# Patient Record
Sex: Female | Born: 1995 | Hispanic: No
Health system: Southern US, Community
[De-identification: ages and names within clinical notes are randomized; demographics above are authoritative.]

## PROBLEM LIST (undated history)

## (undated) DIAGNOSIS — L309 Dermatitis, unspecified: Secondary | ICD-10-CM

## (undated) DIAGNOSIS — T50902A Poisoning by unspecified drugs, medicaments and biological substances, intentional self-harm, initial encounter: Secondary | ICD-10-CM

## (undated) DIAGNOSIS — F1910 Other psychoactive substance abuse, uncomplicated: Secondary | ICD-10-CM

## (undated) DIAGNOSIS — F913 Oppositional defiant disorder: Secondary | ICD-10-CM

## (undated) HISTORY — PX: WISDOM TOOTH EXTRACTION: SHX21

---
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
## 2013-04-19 ENCOUNTER — Encounter (HOSPITAL_COMMUNITY): Payer: Self-pay | Admitting: *Deleted

## 2013-04-19 ENCOUNTER — Emergency Department (HOSPITAL_COMMUNITY)
Admission: EM | Admit: 2013-04-19 | Discharge: 2013-04-20 | Disposition: A | Discharge status: Home / Self Care | Payer: Medicaid Other | Attending: Emergency Medicine | Admitting: Emergency Medicine

## 2013-04-19 DIAGNOSIS — F17200 Nicotine dependence, unspecified, uncomplicated: Secondary | ICD-10-CM | POA: Insufficient documentation

## 2013-04-19 DIAGNOSIS — Z79899 Other long term (current) drug therapy: Secondary | ICD-10-CM | POA: Insufficient documentation

## 2013-04-19 DIAGNOSIS — Z3202 Encounter for pregnancy test, result negative: Secondary | ICD-10-CM | POA: Insufficient documentation

## 2013-04-19 DIAGNOSIS — Z872 Personal history of diseases of the skin and subcutaneous tissue: Secondary | ICD-10-CM | POA: Insufficient documentation

## 2013-04-19 DIAGNOSIS — E876 Hypokalemia: Secondary | ICD-10-CM

## 2013-04-19 DIAGNOSIS — R109 Unspecified abdominal pain: Secondary | ICD-10-CM | POA: Insufficient documentation

## 2013-04-19 DIAGNOSIS — Z8659 Personal history of other mental and behavioral disorders: Secondary | ICD-10-CM | POA: Insufficient documentation

## 2013-04-19 LAB — COMPREHENSIVE METABOLIC PANEL
ALT: 7 U/L (ref 0–35)
AST: 17 U/L (ref 0–37)
Albumin: 4.2 g/dL (ref 3.5–5.2)
Alkaline Phosphatase: 119 U/L — ABNORMAL HIGH (ref 39–117)
BUN: 3 mg/dL — ABNORMAL LOW (ref 6–23)
CO2: 25 mEq/L (ref 19–32)
Calcium: 10.1 mg/dL (ref 8.4–10.5)
Chloride: 102 mEq/L (ref 96–112)
Creatinine, Ser: 0.65 mg/dL (ref 0.50–1.10)
GFR calc Af Amer: >90 mL/min (ref >90)
GFR calc non Af Amer: >90 mL/min (ref >90)
Glucose, Bld: 120 mg/dL — ABNORMAL HIGH (ref 70–99)
Potassium: 3 mEq/L — ABNORMAL LOW (ref 3.5–5.1)
Sodium: 138 mEq/L (ref 135–145)
Total Bilirubin: 0.3 mg/dL (ref 0.3–1.2)
Total Protein: 9.1 g/dL — ABNORMAL HIGH (ref 6.0–8.3)

## 2013-04-19 LAB — CBC WITH DIFFERENTIAL/PLATELET
Basophils Absolute: 0 10*3/uL (ref 0.0–0.1)
Basophils Relative: 0 % (ref 0–1)
Eosinophils Absolute: 0.4 10*3/uL (ref 0.0–0.7)
Eosinophils Relative: 5 % (ref 0–5)
HCT: 41.2 % (ref 36.0–46.0)
Hemoglobin: 14.6 g/dL (ref 12.0–15.0)
Lymphocytes Relative: 38 % (ref 12–46)
Lymphs Abs: 2.8 10*3/uL (ref 0.7–4.0)
MCH: 31.5 pg (ref 26.0–34.0)
MCHC: 35.4 g/dL (ref 30.0–36.0)
MCV: 89 fL (ref 78.0–100.0)
Monocytes Absolute: 0.9 10*3/uL (ref 0.1–1.0)
Monocytes Relative: 13 % — ABNORMAL HIGH (ref 3–12)
Neutro Abs: 3.4 10*3/uL (ref 1.7–7.7)
Neutrophils Relative %: 44 % (ref 43–77)
Platelets: 434 10*3/uL — ABNORMAL HIGH (ref 150–400)
RBC: 4.63 MIL/uL (ref 3.87–5.11)
RDW: 12.7 % (ref 11.5–15.5)
WBC: 7.5 10*3/uL (ref 4.0–10.5)

## 2013-04-19 LAB — LIPASE, BLOOD: Lipase: 49 U/L (ref 11–59)

## 2013-04-19 MED ORDER — ONDANSETRON HCL 4 MG/2ML IJ SOLN
4.0000 mg | Freq: Once | INTRAMUSCULAR | Status: AC
  Administered 2013-04-19: 4 mg via INTRAVENOUS
  Filled 2013-04-19: qty 2

## 2013-04-19 MED ORDER — FENTANYL CITRATE 0.05 MG/ML IJ SOLN
50.0000 ug | Freq: Once | INTRAMUSCULAR | Status: AC
  Administered 2013-04-19: 50 ug via INTRAVENOUS
  Filled 2013-04-19: qty 2

## 2013-04-19 NOTE — ED Notes (Signed)
Pt informed of a need for another urine sample.

## 2013-04-19 NOTE — ED Notes (Signed)
Pt screaming in room and demands to be seen by EDP.  Lying on floor-skin is warm and dry-pt states "I need pain medication now".  Pt back in bed-Mother at bedside.

## 2013-04-19 NOTE — ED Notes (Signed)
Pt in c/o abd pain with nausea x2 hours. History of ovarian cysts.

## 2013-04-19 NOTE — ED Notes (Signed)
Informed by lab that urine sample was not enough to run test.

## 2013-04-19 NOTE — ED Notes (Signed)
Lab informed a newly acquired urine sample will be sent to them.

## 2013-04-20 LAB — URINALYSIS, ROUTINE W REFLEX MICROSCOPIC
Bilirubin Urine: NEGATIVE
Glucose, UA: NEGATIVE mg/dL
Ketones, ur: NEGATIVE mg/dL
Leukocytes, UA: NEGATIVE
Nitrite: NEGATIVE
Protein, ur: NEGATIVE mg/dL
Specific Gravity, Urine: 1.022 (ref 1.005–1.030)
Urobilinogen, UA: 0.2 mg/dL (ref 0.0–1.0)
pH: 5 (ref 5.0–8.0)

## 2013-04-20 LAB — GC/CHLAMYDIA PROBE AMP
CT Probe RNA: NEGATIVE
GC Probe RNA: NEGATIVE

## 2013-04-20 LAB — RAPID URINE DRUG SCREEN, HOSP PERFORMED
Barbiturates: NOT DETECTED
Opiates: NOT DETECTED
Tetrahydrocannabinol: NOT DETECTED

## 2013-04-20 LAB — URINE MICROSCOPIC-ADD ON

## 2013-04-20 LAB — RPR: RPR Ser Ql: NONREACTIVE

## 2013-04-20 LAB — WET PREP, GENITAL
Clue Cells Wet Prep HPF POC: NONE SEEN
Trich, Wet Prep: NONE SEEN
Yeast Wet Prep HPF POC: NONE SEEN

## 2013-04-20 MED ORDER — KETOROLAC TROMETHAMINE 30 MG/ML IJ SOLN
15.0000 mg | Freq: Once | INTRAMUSCULAR | Status: AC
  Administered 2013-04-20: 15 mg via INTRAVENOUS
  Filled 2013-04-20: qty 1

## 2013-04-20 MED ORDER — HYDROMORPHONE HCL PF 1 MG/ML IJ SOLN
1.0000 mg | Freq: Once | INTRAMUSCULAR | Status: AC
  Administered 2013-04-20: 1 mg via INTRAVENOUS
  Filled 2013-04-20: qty 1

## 2013-04-20 MED ORDER — POTASSIUM CHLORIDE CRYS ER 20 MEQ PO TBCR
60.0000 meq | EXTENDED_RELEASE_TABLET | Freq: Once | ORAL | Status: AC
  Administered 2013-04-20: 60 meq via ORAL
  Filled 2013-04-20: qty 3

## 2013-04-20 MED ORDER — ONDANSETRON HCL 4 MG/2ML IJ SOLN
4.0000 mg | Freq: Once | INTRAMUSCULAR | Status: AC
Start: 2013-04-20 — End: 2013-04-20
  Administered 2013-04-20: 4 mg via INTRAVENOUS
  Filled 2013-04-20: qty 2

## 2013-04-20 MED ORDER — TRAMADOL HCL 50 MG PO TABS: 50.0000 mg | ORAL_TABLET | Freq: Four times a day (QID) | ORAL | Status: DC | PRN

## 2013-04-20 NOTE — ED Provider Notes (Signed)
History    17 year old female with abdominal pain. Pain is going on for 2 hours. Pain is across lower abdomen. This clinical S. Patient reports a history of ovarian cyst and states that her current symptoms feel similar to prior episodes of pain which were attributed to this. No vomiting. No unusual vaginal bleeding or discharge. No urinary complaints. No fevers or chills. No intervention prior to arrival.  CSN: [id number]  Arrival date & time 04/19/13  2130   First MD Initiated Contact with Patient 04/20/13 0005      Chief Complaint  Patient presents with  . Abdominal Pain    (Consider location/radiation/quality/duration/timing/severity/associated sxs/prior treatment) HPI  Past Medical History  Diagnosis Date  . Eczema   . Polysubstance abuse     cocaine marijuana  . Drug overdose, intentional   . ODD (oppositional defiant disorder)     Past Surgical History  Procedure Laterality Date  . Wisdom tooth extraction      History reviewed. No pertinent family history.  History  Substance Use Topics  . Smoking status: Current Every Day Smoker -- 1.00 packs/day for 3 years  . Smokeless tobacco: Not on file  . Alcohol Use: Yes    OB History   Grav Para Term Preterm Abortions TAB SAB Ect Mult Living                  Review of Systems  All systems reviewed and negative, other than as noted in HPI.   Allergies  Review of patient's allergies indicates no known allergies.  Home Medications   Current Outpatient Rx  Name  Route  Sig  Dispense  Refill  . cetirizine (ZYRTEC) 10 MG tablet   Oral   Take 10 mg by mouth daily.         Marland Kitchen ibuprofen (ADVIL,MOTRIN) 200 MG tablet   Oral   Take 200 mg by mouth every 6 (six) hours as needed for pain.         . traMADol (ULTRAM) 50 MG tablet   Oral   Take 1 tablet (50 mg total) by mouth every 6 (six) hours as needed for pain.   12 tablet   0     BP 128/90  Pulse 90  Temp(Src) 97.9 F (36.6 C) (Oral)  Resp 20   SpO2 100%  Physical Exam  Nursing note and vitals reviewed. Constitutional: She appears well-developed and well-nourished. No distress.  HENT:  Head: Normocephalic and atraumatic.  Eyes: Conjunctivae are normal. Right eye exhibits no discharge. Left eye exhibits no discharge.  Neck: Neck supple.  Cardiovascular: Normal rate, regular rhythm and normal heart sounds.  Exam reveals no gallop and no friction rub.   No murmur heard. Pulmonary/Chest: Effort normal and breath sounds normal. No respiratory distress.  Abdominal: Soft. She exhibits no distension. There is no tenderness.  Genitourinary:  Normal external female genitalia. No concerning lesions noted. Normal appearing cervix. Scant whitish discharge. No CMT, adnexal mass or tenderness appreciated.   Musculoskeletal: She exhibits no edema and no tenderness.  No cva tenderness  Neurological: She is alert.  Skin: Skin is warm and dry.  Psychiatric: She has a normal mood and affect. Her behavior is normal. Thought content normal.    ED Course  Procedures (including critical care time)  Labs Reviewed  WET PREP, GENITAL - Abnormal; Notable for the following:    WBC, Wet Prep HPF POC RARE (*)    All other components within normal limits  CBC WITH DIFFERENTIAL -  Abnormal; Notable for the following:    Platelets 434 (*)    Monocytes Relative 13 (*)    All other components within normal limits  COMPREHENSIVE METABOLIC PANEL - Abnormal; Notable for the following:    Potassium 3.0 (*)    Glucose, Bld 120 (*)    BUN 3 (*)    Total Protein 9.1 (*)    Alkaline Phosphatase 119 (*)    All other components within normal limits  URINALYSIS, ROUTINE W REFLEX MICROSCOPIC - Abnormal; Notable for the following:    APPearance CLOUDY (*)    Hgb urine dipstick LARGE (*)    All other components within normal limits  GC/CHLAMYDIA PROBE AMP  LIPASE, BLOOD  URINE RAPID DRUG SCREEN (HOSP PERFORMED)  URINE MICROSCOPIC-ADD ON  RPR  POCT PREGNANCY,  URINE   No results found.   1. Abdominal pain   2. Hypokalemia       MDM  17 year old female with lower abdominal/pelvic pain. Her abdominal exam is benign. Her pelvic examination is unremarkable. Very low suspicion for acute surgical abdomen or other emergent process. Plan symptomatic treatment at this time. Emergent return precautions were discussed.        Raeford Razor, MD 04/24/13 2027

## 2014-02-11 ENCOUNTER — Encounter (HOSPITAL_COMMUNITY): Payer: Self-pay | Admitting: Emergency Medicine

## 2014-02-11 ENCOUNTER — Emergency Department (HOSPITAL_COMMUNITY)
Admission: EM | Admit: 2014-02-11 | Discharge: 2014-02-11 | Disposition: A | Discharge status: Home / Self Care | Payer: Medicaid Other | Attending: Emergency Medicine | Admitting: Emergency Medicine

## 2014-02-11 DIAGNOSIS — F17200 Nicotine dependence, unspecified, uncomplicated: Secondary | ICD-10-CM | POA: Insufficient documentation

## 2014-02-11 DIAGNOSIS — Z79899 Other long term (current) drug therapy: Secondary | ICD-10-CM | POA: Insufficient documentation

## 2014-02-11 DIAGNOSIS — F1910 Other psychoactive substance abuse, uncomplicated: Secondary | ICD-10-CM | POA: Insufficient documentation

## 2014-02-11 DIAGNOSIS — F1010 Alcohol abuse, uncomplicated: Secondary | ICD-10-CM | POA: Insufficient documentation

## 2014-02-11 DIAGNOSIS — Z872 Personal history of diseases of the skin and subcutaneous tissue: Secondary | ICD-10-CM | POA: Insufficient documentation

## 2014-02-11 DIAGNOSIS — K2920 Alcoholic gastritis without bleeding: Secondary | ICD-10-CM

## 2014-02-11 DIAGNOSIS — Z3202 Encounter for pregnancy test, result negative: Secondary | ICD-10-CM | POA: Insufficient documentation

## 2014-02-11 DIAGNOSIS — Z8659 Personal history of other mental and behavioral disorders: Secondary | ICD-10-CM | POA: Insufficient documentation

## 2014-02-11 LAB — I-STAT CHEM 8, ED
BUN: <3 mg/dL — ABNORMAL LOW (ref 6–23)
CALCIUM ION: 1.07 mmol/L — AB (ref 1.12–1.23)
Chloride: 105 mEq/L (ref 96–112)
Creatinine, Ser: 0.5 mg/dL (ref 0.50–1.10)
GLUCOSE: 107 mg/dL — AB (ref 70–99)
HEMATOCRIT: 43 % (ref 36.0–46.0)
HEMOGLOBIN: 14.6 g/dL (ref 12.0–15.0)
Potassium: 3.1 mEq/L — ABNORMAL LOW (ref 3.7–5.3)
Sodium: 145 mEq/L (ref 137–147)
TCO2: 18 mmol/L (ref 0–100)

## 2014-02-11 LAB — POC URINE PREG, ED: PREG TEST UR: NEGATIVE

## 2014-02-11 MED ORDER — POTASSIUM CHLORIDE CRYS ER 20 MEQ PO TBCR
40.0000 meq | EXTENDED_RELEASE_TABLET | Freq: Once | ORAL | Status: AC
Start: 2014-02-11 — End: 2014-02-11
  Administered 2014-02-11: 40 meq via ORAL
  Filled 2014-02-11: qty 2

## 2014-02-11 MED ORDER — METOCLOPRAMIDE HCL 10 MG PO TABS: 10.0000 mg | ORAL_TABLET | Freq: Four times a day (QID) | ORAL | Status: DC

## 2014-02-11 MED ORDER — GI COCKTAIL ~~LOC~~
30.0000 mL | Freq: Once | ORAL | Status: AC
  Administered 2014-02-11: 30 mL via ORAL
  Filled 2014-02-11: qty 30

## 2014-02-11 MED ORDER — SODIUM CHLORIDE 0.9 % IV BOLUS (SEPSIS)
2000.0000 mL | Freq: Once | INTRAVENOUS | Status: AC
  Administered 2014-02-11: 2000 mL via INTRAVENOUS

## 2014-02-11 MED ORDER — METOCLOPRAMIDE HCL 5 MG/ML IJ SOLN
10.0000 mg | Freq: Once | INTRAMUSCULAR | Status: AC
  Administered 2014-02-11: 10 mg via INTRAVENOUS
  Filled 2014-02-11: qty 2

## 2014-02-11 MED ORDER — ONDANSETRON HCL 4 MG/2ML IJ SOLN
4.0000 mg | Freq: Once | INTRAMUSCULAR | Status: AC
  Administered 2014-02-11: 4 mg via INTRAVENOUS
  Filled 2014-02-11: qty 2

## 2014-02-11 MED ORDER — PANTOPRAZOLE SODIUM 40 MG IV SOLR
40.0000 mg | Freq: Once | INTRAVENOUS | Status: AC
  Administered 2014-02-11: 40 mg via INTRAVENOUS
  Filled 2014-02-11: qty 40

## 2014-02-11 NOTE — Discharge Instructions (Signed)
Gastritis, Adult If you have a drinking problem, get help. See alcoholics anonymous. Your blood potassium level was slightly low today at 3.1. Get it rechecked within a week.see your primary care physician or go to an urgent care Center to get  Your potassium level rechecked. Take the medicine prescribed today as needed for nausea. Avoid alcohol. Gastritis is soreness and puffiness (inflammation) of the lining of the stomach. If you do not get help, gastritis can cause bleeding and sores (ulcers) in the stomach. HOME CARE   Only take medicine as told by your doctor.  If you were given antibiotic medicines, take them as told. Finish the medicines even if you start to feel better.  Drink enough fluids to keep your pee (urine) clear or pale yellow.  Avoid foods and drinks that make your problems worse. Foods you may want to avoid include:  Caffeine or alcohol.  Chocolate.  Mint.  Garlic and onions.  Spicy foods.  Citrus fruits, including oranges, lemons, or limes.  Food containing tomatoes, including sauce, chili, salsa, and pizza.  Fried and fatty foods.  Eat small meals throughout the day instead of large meals. GET HELP RIGHT AWAY IF:   You have black or dark red poop (stools).  You throw up (vomit) blood. It may look like coffee grounds.  You cannot keep fluids down.  Your belly (abdominal) pain gets worse.  You have a fever.  You do not feel better after 1 week.  You have any other questions or concerns. MAKE SURE YOU:   Understand these instructions.  Will watch your condition.  Will get help right away if you are not doing well or get worse. Document Released: 04/21/2008 Document Revised: 01/26/2012 Document Reviewed: 12/17/2011 [facility] Patient Information 2014 [place], [place].

## 2014-02-11 NOTE — ED Provider Notes (Signed)
CSN: [id number]     Arrival date & time 02/11/14  1105 History   First MD Initiated Contact with Patient 02/11/14 1115     Chief Complaint  Patient presents with  . Emesis     (Consider location/radiation/quality/duration/timing/severity/associated sxs/prior Treatment) HPI Patient complains of multiple episodes of vomiting and diffuse crampy abdominal pain since drinking too much alcohol last night. Denies diarrhea admits to vomiting nothing makes symptoms better or worse no other associated symptoms. Denies other coingestants. Discomfort is constant and nonradiating. No treatment prior to coming here. Past Medical History  Diagnosis Date  . Eczema   . Polysubstance abuse     cocaine marijuana  . Drug overdose, intentional   . ODD (oppositional defiant disorder)    Past Surgical History  Procedure Laterality Date  . Wisdom tooth extraction     History reviewed. No pertinent family history. History  Substance Use Topics  . Smoking status: Current Every Day Smoker -- 1.00 packs/day for 3 years  . Smokeless tobacco: Not on file  . Alcohol Use: Yes   denies recent drug use. OB History   Grav Para Term Preterm Abortions TAB SAB Ect Mult Living                 Review of Systems  Constitutional: Negative.   HENT: Negative.   Respiratory: Negative.   Cardiovascular: Negative.   Gastrointestinal: Positive for nausea, vomiting and abdominal pain.       No hematemesis  Genitourinary:       Last normal menstrual period approximately a week ago  Musculoskeletal: Negative.   Skin: Negative.   Neurological: Negative.   Psychiatric/Behavioral: Negative.   All other systems reviewed and are negative.      Allergies  Review of patient's allergies indicates no known allergies.  Home Medications   Current Outpatient Rx  Name  Route  Sig  Dispense  Refill  . cetirizine (ZYRTEC) 10 MG tablet   Oral   Take 10 mg by mouth daily.         Marland Kitchen ibuprofen (ADVIL,MOTRIN) 200 MG  tablet   Oral   Take 200 mg by mouth every 6 (six) hours as needed for pain.         . traMADol (ULTRAM) 50 MG tablet   Oral   Take 1 tablet (50 mg total) by mouth every 6 (six) hours as needed for pain.   12 tablet   0    BP 139/81  Pulse 91  Temp(Src) 98.1 F (36.7 C) (Oral)  Resp 20  SpO2 100%  LMP 01/15/2014 Physical Exam  Nursing note and vitals reviewed. Constitutional: She appears well-developed and well-nourished. She appears distressed.  Actively vomiting  HENT:  Head: Normocephalic and atraumatic.  Eyes: Conjunctivae are normal. Pupils are equal, round, and reactive to light.  Neck: Neck supple. No tracheal deviation present. No thyromegaly present.  Cardiovascular: Normal rate and regular rhythm.   No murmur heard. Pulmonary/Chest: Effort normal and breath sounds normal.  Abdominal: Soft. Bowel sounds are normal. She exhibits no distension and no mass. There is tenderness. There is no rebound and no guarding.  Epigastric tenderness  Musculoskeletal: Normal range of motion. She exhibits no edema and no tenderness.  Neurological: She is alert. Coordination normal.  Skin: Skin is warm and dry. No rash noted.  Psychiatric: She has a normal mood and affect.    ED Course  Procedures (including critical care time) Labs Review Labs Reviewed - No data to display  Imaging Review No results found.   EKG Interpretation None     Results for orders placed during the hospital encounter of 02/11/14  I-STAT CHEM 8, ED      Result Value Ref Range   Sodium 145  137 - 147 mEq/L   Potassium 3.1 (*) 3.7 - 5.3 mEq/L   Chloride 105  96 - 112 mEq/L   BUN <3 (*) 6 - 23 mg/dL   Creatinine, Ser 2.130.50  0.50 - 1.10 mg/dL   Glucose, Bld 086107 (*) 70 - 99 mg/dL   Calcium, Ion 5.781.07 (*) 1.12 - 1.23 mmol/L   TCO2 18  0 - 100 mmol/L   Hemoglobin 14.6  12.0 - 15.0 g/dL   HCT 46.943.0  62.936.0 - 52.846.0 %  POC URINE PREG, ED      Result Value Ref Range   Preg Test, Ur NEGATIVE  NEGATIVE    No results found.  3:05 PM patient feels much improved after treatment with GI cocktail,ppi inhibitor, intravenous fluids and antiemetics. She had a drink without difficulty she is eating crackers feels ready to go home MDM   Final diagnoses:  None   Plan prescription Reglan  Diagnosis #1 alcoholic gastritis #2 hypokalemia     Doug SouSam Jazzmyn Filion, MD 02/11/14 1510

## 2014-02-11 NOTE — ED Notes (Signed)
Patient reports that she drank too much last night at a birthday party, she has been having N/V/d with abdominal pain since last

## 2014-06-26 ENCOUNTER — Encounter (HOSPITAL_COMMUNITY): Payer: Self-pay | Admitting: Emergency Medicine

## 2014-06-26 ENCOUNTER — Emergency Department (HOSPITAL_COMMUNITY)
Admission: EM | Admit: 2014-06-26 | Discharge: 2014-06-26 | Discharge status: Against Medical Advice | Payer: Medicaid Other | Attending: Emergency Medicine | Admitting: Emergency Medicine

## 2014-06-26 DIAGNOSIS — R5381 Other malaise: Secondary | ICD-10-CM | POA: Insufficient documentation

## 2014-06-26 DIAGNOSIS — R5383 Other fatigue: Principal | ICD-10-CM

## 2014-06-26 DIAGNOSIS — R52 Pain, unspecified: Secondary | ICD-10-CM | POA: Insufficient documentation

## 2014-06-26 DIAGNOSIS — F17200 Nicotine dependence, unspecified, uncomplicated: Secondary | ICD-10-CM | POA: Insufficient documentation

## 2014-06-26 NOTE — ED Notes (Signed)
Pt ambulatory to desk, pt encouraged to stay

## 2014-06-26 NOTE — ED Notes (Signed)
Pt. reports fatigue " feels tired" with generalized body aches / lightheaded onset yesterday . Denies fever or chills.

## 2014-06-27 ENCOUNTER — Emergency Department (HOSPITAL_COMMUNITY)
Admission: EM | Admit: 2014-06-27 | Discharge: 2014-06-27 | Disposition: A | Discharge status: Home / Self Care | Payer: Medicaid Other | Attending: Emergency Medicine | Admitting: Emergency Medicine

## 2014-06-27 ENCOUNTER — Encounter (HOSPITAL_COMMUNITY): Payer: Self-pay | Admitting: Emergency Medicine

## 2014-06-27 DIAGNOSIS — B9789 Other viral agents as the cause of diseases classified elsewhere: Secondary | ICD-10-CM | POA: Insufficient documentation

## 2014-06-27 DIAGNOSIS — F17200 Nicotine dependence, unspecified, uncomplicated: Secondary | ICD-10-CM | POA: Insufficient documentation

## 2014-06-27 DIAGNOSIS — R5381 Other malaise: Secondary | ICD-10-CM | POA: Insufficient documentation

## 2014-06-27 DIAGNOSIS — Z79899 Other long term (current) drug therapy: Secondary | ICD-10-CM | POA: Insufficient documentation

## 2014-06-27 DIAGNOSIS — Z872 Personal history of diseases of the skin and subcutaneous tissue: Secondary | ICD-10-CM | POA: Insufficient documentation

## 2014-06-27 DIAGNOSIS — R5383 Other fatigue: Secondary | ICD-10-CM

## 2014-06-27 DIAGNOSIS — J029 Acute pharyngitis, unspecified: Secondary | ICD-10-CM | POA: Insufficient documentation

## 2014-06-27 DIAGNOSIS — D649 Anemia, unspecified: Secondary | ICD-10-CM | POA: Insufficient documentation

## 2014-06-27 DIAGNOSIS — B349 Viral infection, unspecified: Secondary | ICD-10-CM

## 2014-06-27 LAB — CBC WITH DIFFERENTIAL/PLATELET
BASOS ABS: 0 10*3/uL (ref 0.0–0.1)
Basophils Relative: 0 % (ref 0–1)
EOS ABS: 0.3 10*3/uL (ref 0.0–0.7)
Eosinophils Relative: 4 % (ref 0–5)
HCT: 30.9 % — ABNORMAL LOW (ref 36.0–46.0)
Hemoglobin: 10.7 g/dL — ABNORMAL LOW (ref 12.0–15.0)
LYMPHS ABS: 1.4 10*3/uL (ref 0.7–4.0)
Lymphocytes Relative: 16 % (ref 12–46)
MCH: 30.8 pg (ref 26.0–34.0)
MCHC: 34.6 g/dL (ref 30.0–36.0)
MCV: 89 fL (ref 78.0–100.0)
Monocytes Absolute: 1.2 10*3/uL — ABNORMAL HIGH (ref 0.1–1.0)
Monocytes Relative: 14 % — ABNORMAL HIGH (ref 3–12)
Neutro Abs: 5.7 10*3/uL (ref 1.7–7.7)
Neutrophils Relative %: 66 % (ref 43–77)
PLATELETS: 218 10*3/uL (ref 150–400)
RBC: 3.47 MIL/uL — ABNORMAL LOW (ref 3.87–5.11)
RDW: 13.2 % (ref 11.5–15.5)
WBC: 8.5 10*3/uL (ref 4.0–10.5)

## 2014-06-27 LAB — BASIC METABOLIC PANEL
Anion gap: 12 (ref 5–15)
BUN: 6 mg/dL (ref 6–23)
CO2: 23 mEq/L (ref 19–32)
Calcium: 8.7 mg/dL (ref 8.4–10.5)
Chloride: 103 mEq/L (ref 96–112)
Creatinine, Ser: 0.46 mg/dL — ABNORMAL LOW (ref 0.50–1.10)
GFR calc Af Amer: >90 mL/min (ref >90)
GFR calc non Af Amer: >90 mL/min (ref >90)
GLUCOSE: 101 mg/dL — AB (ref 70–99)
Potassium: 3.8 mEq/L (ref 3.7–5.3)
SODIUM: 138 meq/L (ref 137–147)

## 2014-06-27 LAB — RAPID STREP SCREEN (MED CTR MEBANE ONLY): Streptococcus, Group A Screen (Direct): NEGATIVE

## 2014-06-27 LAB — MONONUCLEOSIS SCREEN: MONO SCREEN: NEGATIVE

## 2014-06-27 MED ORDER — IBUPROFEN 800 MG PO TABS
800.0000 mg | ORAL_TABLET | Freq: Once | ORAL | Status: AC
  Administered 2014-06-27: 800 mg via ORAL
  Filled 2014-06-27: qty 1

## 2014-06-27 MED ORDER — FERROUS SULFATE 325 (65 FE) MG PO TABS: 325.0000 mg | ORAL_TABLET | Freq: Every day | ORAL | Status: AC

## 2014-06-27 MED ORDER — LORATADINE-PSEUDOEPHEDRINE ER 5-120 MG PO TB12
1.0000 | ORAL_TABLET | Freq: Two times a day (BID) | ORAL | Status: DC
Start: 2014-06-27 — End: 2016-01-06

## 2014-06-27 MED ORDER — DESLORATADINE-PSEUDOEPHED ER 2.5-120 MG PO TB12: 1.0000 | ORAL_TABLET | Freq: Two times a day (BID) | ORAL | Status: DC

## 2014-06-27 NOTE — ED Provider Notes (Signed)
CSN: [id number]     Arrival date & time 06/27/14  7829 History   First MD Initiated Contact with Patient 06/27/14 0703     Chief Complaint  Patient presents with  . Sore Throat  . Weakness     (Consider location/radiation/quality/duration/timing/severity/associated sxs/prior Treatment) HPI Comments: Patient is a 18 year old female who presents with a 4 day history of sore throat, fatigue, lightheadedness and ear pain. Symptoms started gradually and progressively worsened since the onset. The throat pain is sharp and severe. The pain is constant and made worse with swallowing. The pain is localized to the patient's throat and equal on both sides. Patient has tried ibuprofen for symptoms which provide relief. Patient reports feeling a difference when the ibuprofen wears off. Patient reports associated cervical adenopathy. Patient denies headache, visual changes, sinus congestion, difficulty breathing, chest pain, SOB, abdominal pain, NVD. She denies any sick contacts.      Past Medical History  Diagnosis Date  . Eczema   . Polysubstance abuse     cocaine marijuana  . Drug overdose, intentional   . ODD (oppositional defiant disorder)    Past Surgical History  Procedure Laterality Date  . Wisdom tooth extraction     No family history on file. History  Substance Use Topics  . Smoking status: Current Every Day Smoker -- 1.00 packs/day for 3 years  . Smokeless tobacco: Not on file  . Alcohol Use: Yes   OB History   Grav Para Term Preterm Abortions TAB SAB Ect Mult Living                 Review of Systems  Constitutional: Positive for fatigue. Negative for fever and chills.  HENT: Positive for congestion, ear pain and sore throat. Negative for trouble swallowing.   Eyes: Negative for visual disturbance.  Respiratory: Negative for shortness of breath.   Cardiovascular: Negative for chest pain and palpitations.  Gastrointestinal: Negative for nausea, vomiting, abdominal pain and  diarrhea.  Genitourinary: Negative for dysuria and difficulty urinating.  Musculoskeletal: Negative for arthralgias and neck pain.  Skin: Negative for color change.  Neurological: Negative for dizziness and weakness.  Psychiatric/Behavioral: Negative for dysphoric mood.      Allergies  Review of patient's allergies indicates no known allergies.  Home Medications   Prior to Admission medications   Medication Sig Start Date End Date Taking? Authorizing Provider  metoCLOPramide (REGLAN) 10 MG tablet Take 1 tablet (10 mg total) by mouth every 6 (six) hours. 02/11/14   Doug Sou, MD   BP 156/88  Pulse 96  Temp(Src) 98.7 F (37.1 C) (Oral)  Resp 18  SpO2 99%  LMP 06/20/2014 Physical Exam  Nursing note and vitals reviewed. Constitutional: She is oriented to person, place, and time. She appears well-developed and well-nourished. No distress.  HENT:  Head: Normocephalic and atraumatic.  Mouth/Throat: Oropharynx is clear and moist. No oropharyngeal exudate.  Posterior pharynx erythema without tonsillar exudate  Eyes: Conjunctivae and EOM are normal.  Neck: Normal range of motion.  Cardiovascular: Normal rate and regular rhythm.  Exam reveals no gallop and no friction rub.   No murmur heard. Pulmonary/Chest: Effort normal and breath sounds normal. She has no wheezes. She has no rales. She exhibits no tenderness.  Abdominal: Soft. She exhibits no distension. There is no tenderness. There is no rebound and no guarding.  Musculoskeletal: Normal range of motion.  Lymphadenopathy:    She has cervical adenopathy.  Neurological: She is alert and oriented to person,  place, and time. Coordination normal.  Speech is goal-oriented. Moves limbs without ataxia.   Skin: Skin is warm and dry.  Psychiatric: She has a normal mood and affect. Her behavior is normal.    ED Course  Procedures (including critical care time) Labs Review Labs Reviewed  CBC WITH DIFFERENTIAL - Abnormal; Notable  for the following:    RBC 3.47 (*)    Hemoglobin 10.7 (*)    HCT 30.9 (*)    Monocytes Relative 14 (*)    Monocytes Absolute 1.2 (*)    All other components within normal limits  BASIC METABOLIC PANEL - Abnormal; Notable for the following:    Glucose, Bld 101 (*)    Creatinine, Ser 0.46 (*)    All other components within normal limits  RAPID STREP SCREEN  CULTURE, GROUP A STREP  MONONUCLEOSIS SCREEN    Imaging Review No results found.   EKG Interpretation None      MDM   Final diagnoses:  Viral illness  Anemia, unspecified anemia type    7:17 AM Labs, mono and rapid strep pending. Vitals stable and patient afebrile.   8:45 AM Labs show low hemoglobin likely due to heavy menstrual cycle after further conversation with the patient. Patient will be treated with iron supplements. Patient has a negative strep test. Patient likely has viral illness and will be treated with Claritin-D. Vitals stable and patient afebrile. Patient instructed to follow up with PCP and return to the ED with worsening or concerning symptoms.   Hemoglobin  Date Value Ref Range Status  06/27/2014 10.7* 12.0 - 15.0 g/dL Final  8/65/78463/28/2015 96.214.6  12.0 - 15.0 g/dL Final  9/5/28416/01/2013 32.414.6  40.112.0 - 15.0 g/dL Final  0/27/25364/24/2013 64.413.1  12.0 - 15.0 g/dL Final       Emilia BeckKaitlyn Myrta Mercer, PA-C 06/27/14 [phone]

## 2014-06-27 NOTE — Discharge Instructions (Signed)
Take Claritin-D for your viral illness symptoms. Take iron supplements as directed to improve your blood count. Refer to attached documents for more information. Return to the ED with worsening or concerning symptoms.    Emergency Department Resource Guide 1) Find a Doctor and Pay Out of Pocket Although you won't have to find out who is covered by your insurance plan, it is a good idea to ask around and get recommendations. You will then need to call the office and see if the doctor you have chosen will accept you as a new patient and what types of options they offer for patients who are self-pay. Some doctors offer discounts or will set up payment plans for their patients who do not have insurance, but you will need to ask so you aren't surprised when you get to your appointment.  2) Contact Your Local Health Department Not all health departments have doctors that can see patients for sick visits, but many do, so it is worth a call to see if yours does. If you don't know where your local health department is, you can check in your phone book. The CDC also has a tool to help you locate your state's health department, and many state websites also have listings of all of their local health departments.  3) Find a Walk-in Clinic If your illness is not likely to be very severe or complicated, you may want to try a walk in clinic. These are popping up all over the country in pharmacies, drugstores, and shopping centers. They're usually staffed by nurse practitioners or physician assistants that have been trained to treat common illnesses and complaints. They're usually fairly quick and inexpensive. However, if you have serious medical issues or chronic medical problems, these are probably not your best option.  No Primary Care Doctor: - Call Health Connect at  [phone] - they can help you locate a primary care doctor that  accepts your insurance, provides certain services, etc. - Physician Referral  Service- [phone]  Chronic Pain Problems: Organization         Address  Phone   Notes  [facility]  [phone] Patients need to be referred by their primary care doctor.   Medication Assistance: Organization         Address  Phone   Notes  [facility] [address] [place], [place] [address] [phone]) [phone] --Must be a resident of [facility] [place] -- Must have NO insurance coverage whatsoever (no Medicaid/ Medicare, etc.) -- The pt. MUST have a primary care doctor that directs their care regularly and follows them in the community   MedAssist  [phone]   [employer]  [phone]    Agencies that provide inexpensive medical care: Organization         Address  Phone   Notes  [facility]  [phone]   [facility]    [phone]   [facility] [address] [place], [place] [address] [phone]   [facility] [address], [place] [phone]   Planned Parenthood    [phone]   [facility]    [phone]   [facility]  [address], [place] Phone:  [phone], Fax:  [phone] Hours of Operation:  9 am - 6 pm, M-F.  Also accepts Medicaid/Medicare and self-pay.  [facility]  [address], [place] Phone: [phone], Fax: [phone]. Hours of Operation:  8:30 am - 5:30 pm, M-F.  Also accepts Medicaid and self-pay.  [facility] [place] [address] [place] Phone: [phone]   George Mason, [place], [place] [phone], Ext. [phone] Mondays & Thursdays: 7-9 AM.  First 15 patients are seen on a first come, first serve basis.    [place] Providers:  Organization         Address  Phone   Notes  [facility] [address], [place] [phone] Also accepts self-pay patients.  [facility] [address], [place]  [phone]   [place], [address], [place] [phone]   [facility] [address], [place] [phone]   Lucianne Lei [address], [place]   [phone] Only accepts [place] Access [place] patients after they have their name applied to their card.   Self-Pay (no insurance) in [facility]:  Organization         Address  Phone   Notes  Sickle Cell Patients, [facility] [place] [phone]   [facility] [place] [phone]   [facility] [place]  [place], [place],  [phone]   [facility]/Dr. Osei-Bonsu  [address], [place] or [address], [place] [phone] Phone number for both [place] and [place] locations is the same.  [facility]gent [facility] and [facility] [address], [place] [phone]   [facility] [address] [place] or [address] [phone]   [facility] [address], [place] [phone], phone; [phone], fax Sees patients 1st and 3rd Saturday of every month.  Must not qualify for public or private insurance (i.e. Medicaid, Medicare, [place] Health Choice, Veterans' Benefits)  Household income should be no more than 200% of the poverty level The clinic cannot treat you if you are pregnant or think you are pregnant  Sexually transmitted diseases are not treated at the clinic.    Dental Care: Organization         Address  Phone  Notes  [facility] Department of [facility] [phone] Accepts children up to age 4 who are enrolled in  [place] or [place]; pregnant women with a Medicaid card; and children who have applied for Medicaid or [place] Health Choice, but were declined, whose parents can pay a reduced fee at time of service.  [facility] of [facility]  [address], [place] [phone] Accepts children up to age 46 who are enrolled in [place] or [place]; pregnant women with a Medicaid card; and children who have applied for Medicaid or [place] Health Choice, but were declined, whose parents can pay a reduced fee at time of service.  [place] Adult [facility] Access PROGRAM  [place] [phone] Patients are seen by appointment only. Walk-ins are not accepted. [facility] will see patients 39 years of age and older. Monday - Tuesday (8am-5pm) Most Wednesdays (8:30-5pm) $30 per visit, cash only  [place] [facility]  [address], [facility] [phone] Patients are seen by appointment only. Walk-ins are not accepted. [place] will see patients 55 years of age and older. One Wednesday Evening (Monthly: Volunteer Based).  $30 per visit, cash only  [place]  [phone] for adults; Children under age 16, call Graduate Pediatric Dentistry at [phone]. Children aged 15-14, please call [phone] to request a pediatric application.  Dental services are provided in all areas of dental care including fillings, crowns and bridges, complete and partial dentures, implants, gum treatment, root canals, and extractions. Preventive care is also provided. Treatment is provided to both adults and children. Patients are selected via a lottery and there is often a waiting list.   [facility] [address] [place]  [phone] [email or web address]   [facility] [address] [place], [place] [phone], Ext. [phone] Second and Fourth Thursday of each month, opens at 6:30  AM; Clinic ends at 9 AM.  Patients are seen on a first-come first-served basis, and a limited number are seen during each clinic.   [facility]  [address] Hillard Danker [place], [place] [phone]   Eligibility Requirements You must have lived in [place], [place], or [place] counties for at least the last three months.   You cannot be eligible for state or federal sponsored [employer], including [employer], [place], or [employer].   You generally cannot be eligible for healthcare insurance through your employer.    How to apply: Eligibility screenings are held every Tuesday and Wednesday afternoon from 1:00 pm until 4:00 pm. You do not need an appointment for the interview!  [facility] [address] [place], [place]   [employer]  [place] Department  [place]  [phone]    Behavioral Health Resources in the Community: Intensive Outpatient Programs Organization         Address  Phone  Notes  [place]. [address], [place], [place] [phone]   [facility] [address], [place], [employer]   ADS: Alcohol & Drug Svcs [address], [place], [place]   [place] [address]  [place], [place] or [phone]   Substance Abuse Resources Organization         Address  Phone  Notes  Alcohol and Drug Services  [phone]   [place]  [phone]   [facility] [place]   Chinita Pester  [phone]   Residential & Outpatient Substance Abuse Program  [phone]   Psychological Services Organization         Address  Phone  Notes  [facility] [place]  [place]  [phone]   [place] [address] [place] [phone] or  [phone]    Mobile Crisis Teams Organization         Address  Phone  Notes  [facility], Mobile Crisis Care Unit  [phone]   [facility] Psychotherapeutic Services  [address] [place], [place]   [place] [address] [place] [phone]    Self-Help/Support Groups Organization         Address  Phone             Notes  [facility]ental Health Assoc. of [place] - variety of support groups  [place] Call for more information  Narcotics Anonymous (NA), Caring Services [address], [employer] [place]  2 meetings at this location   Special educational needs teacher         Address  Phone  Notes  [facility]idential Treatment [place],    [place]  [phone]   [facility]  [address], [place] [address], [place], [place]   [place], [place] [phone] Admissions: 8am-3pm M-F  Incentives Substance [place] [address],    [place], [place] [phone]   [facility] [address] [place], [place], [place]   [facility] [address],  [place], [place]   [facility] - Intensive Outpatient [address], [place], [place]   [facility] ([place].,  [place], [place] [phone] or [phone]   [facility] Services (RTS) [address] [place], [place] Accepts Medicaid  [facility] [place] [address]  [place] [phone] Substance Abuse/Addiction Treatment   [facility] Organization         Address  Phone  Notes  [facility]  [phone]   Domenic Schwab, PhD [address] Arlis Porta [place], [place]   [phone] or [phone]   [place] Per[place], [place] [phone]   [facility] [address],  [place], [place] [phone] Insurance/Medicaid/sponsorship through [facility] and Families [address], [address]                                    [place], [place] [phone] [place] [address], [place] [phone]    Dr. Adele Schilder  [phone]   [facility] Dept. 1) [address] [place] 2) [place] 3)  [place] [address], [place] [phone]  [phone]   [place] [phone] or [phone] (After Hours)

## 2014-06-27 NOTE — ED Notes (Addendum)
Pt from home c/o weakness, ear pain, and sore throat x few days. Denies fevers and has been taking Ibuprofen several times a day. [facility] but states " I only saw the nurse and nurse tech".

## 2014-06-27 NOTE — Progress Notes (Signed)
  CARE MANAGEMENT ED NOTE 06/27/2014  Patient:  Amanda Khan,Amanda Khan   Account Number:  [id number]  Date Initiated:  06/27/2014  Documentation initiated by:  Edd ArbourGIBBS,Ronika Kelson  Subjective/Objective Assessment:   18 yr old [place] access [place] [facility] and d/c with rx for ferrous sulfate tablet 325 mg 1 tab qd total 30 tabs no refills, Clarinex 12 tab 1 tab bid with 20 tabs no refills     Subjective/Objective Assessment Detail:   pcp elizabeth mulberry     Action/Plan:   ED CM [facility] requesting clarification of Clarinex Rx recommending change to Claritin d or allergra d. Clarinex no available. Cm spoke with PAC, Kaitlyn New Rx provided & [facility] [phone]   Action/Plan Detail:   CM called and provided verbal [facility] to [facility] [phone] at 1140 for Claritin d 12 hr 1 tab bid total 20 tabs with no refills Fax confirmation received at 1136   Anticipated DC Date:  06/27/2014     Status Recommendation to Physician:   Result of Recommendation:    Other ED Services  Consult Working Plan    DC Planning Services  Other  Outpatient Services - Pt will follow up  Medication Assistance    Choice offered to / List presented to:            Status of service:  Completed, signed off  ED Comments:   ED Comments Detail:

## 2014-06-27 NOTE — ED Provider Notes (Signed)
Medical screening examination/treatment/procedure(s) were performed by non-physician practitioner and as supervising physician I was immediately available for consultation/collaboration.   EKG Interpretation None        Lyanne CoKevin M Aireana Ryland, MD 06/27/14 [phone]

## 2014-06-27 NOTE — ED Notes (Signed)
PA student at bedside.

## 2014-06-28 ENCOUNTER — Emergency Department (INDEPENDENT_AMBULATORY_CARE_PROVIDER_SITE_OTHER)
Admission: EM | Admit: 2014-06-28 | Discharge: 2014-06-28 | Disposition: A | Discharge status: Home / Self Care | Payer: Self-pay | Source: Home / Self Care | Attending: Family Medicine | Admitting: Family Medicine

## 2014-06-28 ENCOUNTER — Emergency Department (INDEPENDENT_AMBULATORY_CARE_PROVIDER_SITE_OTHER): Payer: Self-pay

## 2014-06-28 ENCOUNTER — Encounter (HOSPITAL_COMMUNITY): Payer: Self-pay | Admitting: Emergency Medicine

## 2014-06-28 DIAGNOSIS — J069 Acute upper respiratory infection, unspecified: Secondary | ICD-10-CM

## 2014-06-28 MED ORDER — PREDNISONE 50 MG PO TABS: 50.0000 mg | ORAL_TABLET | Freq: Every day | ORAL | Status: DC

## 2014-06-28 MED ORDER — GUAIFENESIN-CODEINE 100-10 MG/5ML PO SOLN: 5.0000 mL | ORAL | Status: DC | PRN

## 2014-06-28 MED ORDER — AZITHROMYCIN 250 MG PO TABS: ORAL_TABLET | ORAL | Status: DC

## 2014-06-28 NOTE — ED Notes (Signed)
C/o chest congestion and cough that is prod. of light brownish sputum onset today.  C/o weakness on Sunday early AM.  She went to the ED the next day.  She left without seeing the doctor.  [facility] with weakness and lightheaded.  They told her she had a viral illness and anemia.  They told her she could go to work.  She left work after a few hours, last night.  C/o feeling worse today.

## 2014-06-28 NOTE — ED Provider Notes (Signed)
CSN: [id number]     Arrival date & time 06/28/14  1857 History   First MD Initiated Contact with Patient 06/28/14 1943     Chief Complaint  Patient presents with  . Cough   (Consider location/radiation/quality/duration/timing/severity/associated sxs/prior Treatment) HPI Comments: 18 year old female presents for evaluation of productive cough, nasal congestion, chest congestion, fatigue. Her symptoms initially started on Sunday with fatigue and weakness. She went to the emergency department but left without being seen. The next day she went back to the emergency department and was diagnosed with a viral upper respiratory infection and anemia, likely secondary to menses. She was treated with Claritin-D and iron. Today the dizziness has resolved, but yesterday she started to get a cough. She still doesn't feel well. She did not feel well enough to go to work and needs another workup, and she also is wondering what she can take  to make this completely better immediately. She denies chest pain or shortness of breath. No fever.   Past Medical History  Diagnosis Date  . Eczema   . Polysubstance abuse     cocaine marijuana  . Drug overdose, intentional   . ODD (oppositional defiant disorder)    Past Surgical History  Procedure Laterality Date  . Wisdom tooth extraction     History reviewed. No pertinent family history. History  Substance Use Topics  . Smoking status: Current Every Day Smoker -- 0.33 packs/day for 3 years    Types: Cigarettes  . Smokeless tobacco: Not on file  . Alcohol Use: Yes     Comment: 2 x /month   OB History   Grav Para Term Preterm Abortions TAB SAB Ect Mult Living                 Review of Systems  Constitutional: Positive for fatigue.  HENT: Positive for congestion.   Respiratory: Positive for cough.   Neurological: Positive for dizziness.  All other systems reviewed and are negative.   Allergies  Review of patient's allergies indicates no known  allergies.  Home Medications   Prior to Admission medications   Medication Sig Start Date End Date Taking? Authorizing Provider  cetirizine-pseudoephedrine (ZYRTEC-D) 5-120 MG per tablet Take 1 tablet by mouth 2 (two) times daily.   Yes Historical Provider, MD  ferrous sulfate 325 (65 FE) MG tablet Take 1 tablet (325 mg total) by mouth daily. 06/27/14  Yes Kaitlyn Szekalski, PA-C  ibuprofen (ADVIL,MOTRIN) 200 MG tablet Take 400 mg by mouth every 6 (six) hours as needed.   Yes Historical Provider, MD  ibuprofen (ADVIL,MOTRIN) 50 MG chewable tablet Chew 200 mg by mouth every 8 (eight) hours as needed for pain or fever.   Yes Historical Provider, MD  omeprazole (PRILOSEC OTC) 20 MG tablet Take 20 mg by mouth daily.   Yes Historical Provider, MD  azithromycin (ZITHROMAX Z-PAK) 250 MG tablet Use as directed 06/28/14   Graylon GoodZachary H Yaqub Arney, PA-C  desloratadine-pseudoephedrine (CLARINEX-D 12 HOUR) 2.5-120 MG per tablet Take 1 tablet by mouth 2 (two) times daily. 06/27/14   Kaitlyn Szekalski, PA-C  guaiFENesin-codeine 100-10 MG/5ML syrup Take 5 mLs by mouth every 4 (four) hours as needed for cough. 06/28/14   Adrian BlackwaterZachary H Sahily Biddle, PA-C  loratadine-pseudoephedrine (CLARITIN-D 12 HOUR) 5-120 MG per tablet Take 1 tablet by mouth 2 (two) times daily. 06/27/14   Kaitlyn Szekalski, PA-C  predniSONE (DELTASONE) 50 MG tablet Take 1 tablet (50 mg total) by mouth daily with breakfast. 06/28/14   Graylon GoodZachary H Felina Tello, PA-C  BP 119/74  Pulse 105  Temp(Src) 98.3 F (36.8 C) (Oral)  Resp 12  SpO2 100%  LMP 06/19/2014 Physical Exam  Nursing note and vitals reviewed. Constitutional: She is oriented to person, place, and time. Vital signs are normal. She appears well-developed and well-nourished. No distress.  HENT:  Head: Normocephalic and atraumatic.  Right Ear: External ear normal.  Left Ear: External ear normal.  Nose: Nose normal. Right sinus exhibits no maxillary sinus tenderness and no frontal sinus tenderness. Left sinus  exhibits no maxillary sinus tenderness and no frontal sinus tenderness.  Mouth/Throat: Uvula is midline, oropharynx is clear and moist and mucous membranes are normal. No oropharyngeal exudate or posterior oropharyngeal erythema.  Eyes: Conjunctivae are normal. Right eye exhibits no discharge. Left eye exhibits no discharge.  Neck: Normal range of motion. Neck supple.  Cardiovascular: Normal rate, regular rhythm and normal heart sounds.   Pulmonary/Chest: Effort normal and breath sounds normal. No respiratory distress.  Lymphadenopathy:    She has no cervical adenopathy.  Neurological: She is alert and oriented to person, place, and time. She has normal strength. Coordination normal.  Skin: Skin is warm and dry. No rash noted. She is not diaphoretic.  Psychiatric: She has a normal mood and affect. Judgment normal.    ED Course  Procedures (including critical care time) Labs Review Labs Reviewed - No data to display  Imaging Review Dg Chest 2 View  06/28/2014   CLINICAL DATA:  Cough and congestion  EXAM: CHEST  2 VIEW  COMPARISON:  None.  FINDINGS: The heart size and mediastinal contours are within normal limits. Both lungs are clear. The visualized skeletal structures are unremarkable.  IMPRESSION: No active cardiopulmonary disease.   Electronically Signed   By: Amie Portland M.D.   On: 06/28/2014 20:23     MDM   1. URI (upper respiratory infection)    Treat symptomatically, also z-pack bc worsening course, f/u PRN   New Prescriptions   AZITHROMYCIN (ZITHROMAX Z-PAK) 250 MG TABLET    Use as directed   GUAIFENESIN-CODEINE 100-10 MG/5ML SYRUP    Take 5 mLs by mouth every 4 (four) hours as needed for cough.   PREDNISONE (DELTASONE) 50 MG TABLET    Take 1 tablet (50 mg total) by mouth daily with breakfast.       Graylon Good, PA-C 06/28/14 2036

## 2014-06-28 NOTE — ED Provider Notes (Signed)
Medical screening examination/treatment/procedure(s) were performed by resident physician or non-physician practitioner and as supervising physician I was immediately available for consultation/collaboration.   Barkley BrunsKINDL,Aleila Syverson DOUGLAS MD.   Linna HoffJames D Elisabel Hanover, MD 06/28/14 2036

## 2014-06-28 NOTE — Discharge Instructions (Signed)
Upper Respiratory Infection, Adult An upper respiratory infection (URI) is also sometimes known as the common cold. The upper respiratory tract includes the nose, sinuses, throat, trachea, and bronchi. Bronchi are the airways leading to the lungs. Most people improve within 1 week, but symptoms can last up to 2 weeks. A residual cough may last even longer.  CAUSES Many different viruses can infect the tissues lining the upper respiratory tract. The tissues become irritated and inflamed and often become very moist. Mucus production is also common. A cold is contagious. You can easily spread the virus to others by oral contact. This includes kissing, sharing a glass, coughing, or sneezing. Touching your mouth or nose and then touching a surface, which is then touched by another person, can also spread the virus. SYMPTOMS  Symptoms typically develop 1 to 3 days after you come in contact with a cold virus. Symptoms vary from person to person. They may include:  Runny nose.  Sneezing.  Nasal congestion.  Sinus irritation.  Sore throat.  Loss of voice (laryngitis).  Cough.  Fatigue.  Muscle aches.  Loss of appetite.  Headache.  Low-grade fever. DIAGNOSIS  You might diagnose your own cold based on familiar symptoms, since most people get a cold 2 to 3 times a year. Your caregiver can confirm this based on your exam. Most importantly, your caregiver can check that your symptoms are not due to another disease such as strep throat, sinusitis, pneumonia, asthma, or epiglottitis. Blood tests, throat tests, and X-rays are not necessary to diagnose a common cold, but they may sometimes be helpful in excluding other more serious diseases. Your caregiver will decide if any further tests are required. RISKS AND COMPLICATIONS  You may be at risk for a more severe case of the common cold if you smoke cigarettes, have chronic heart disease (such as heart failure) or lung disease (such as asthma), or if  you have a weakened immune system. The very young and very old are also at risk for more serious infections. Bacterial sinusitis, middle ear infections, and bacterial pneumonia can complicate the common cold. The common cold can worsen asthma and chronic obstructive pulmonary disease (COPD). Sometimes, these complications can require emergency medical care and may be life-threatening. PREVENTION  The best way to protect against getting a cold is to practice good hygiene. Avoid oral or hand contact with people with cold symptoms. Wash your hands often if contact occurs. There is no clear evidence that vitamin C, vitamin E, echinacea, or exercise reduces the chance of developing a cold. However, it is always recommended to get plenty of rest and practice good nutrition. TREATMENT  Treatment is directed at relieving symptoms. There is no cure. Antibiotics are not effective, because the infection is caused by a virus, not by bacteria. Treatment may include:  Increased fluid intake. Sports drinks offer valuable electrolytes, sugars, and fluids.  Breathing heated mist or steam (vaporizer or shower).  Eating chicken soup or other clear broths, and maintaining good nutrition.  Getting plenty of rest.  Using gargles or lozenges for comfort.  Controlling fevers with ibuprofen or acetaminophen as directed by your caregiver.  Increasing usage of your inhaler if you have asthma. Zinc gel and zinc lozenges, taken in the first 24 hours of the common cold, can shorten the duration and lessen the severity of symptoms. Pain medicines may help with fever, muscle aches, and throat pain. A variety of non-prescription medicines are available to treat congestion and runny nose. Your caregiver   can make recommendations and may suggest nasal or lung inhalers for other symptoms.  HOME CARE INSTRUCTIONS   Only take over-the-counter or prescription medicines for pain, discomfort, or fever as directed by your  caregiver.  Use a warm mist humidifier or inhale steam from a shower to increase air moisture. This may keep secretions moist and make it easier to breathe.  Drink enough water and fluids to keep your urine clear or pale yellow.  Rest as needed.  Return to work when your temperature has returned to normal or as your caregiver advises. You may need to stay home longer to avoid infecting others. You can also use a face mask and careful hand washing to prevent spread of the virus. SEEK MEDICAL CARE IF:   After the first few days, you feel you are getting worse rather than better.  You need your caregiver's advice about medicines to control symptoms.  You develop chills, worsening shortness of breath, or brown or red sputum. These may be signs of pneumonia.  You develop yellow or brown nasal discharge or pain in the face, especially when you bend forward. These may be signs of sinusitis.  You develop a fever, swollen neck glands, pain with swallowing, or white areas in the back of your throat. These may be signs of strep throat. SEEK IMMEDIATE MEDICAL CARE IF:   You have a fever.  You develop severe or persistent headache, ear pain, sinus pain, or chest pain.  You develop wheezing, a prolonged cough, cough up blood, or have a change in your usual mucus (if you have chronic lung disease).  You develop sore muscles or a stiff neck. Document Released: 04/29/2001 Document Revised: 01/26/2012 Document Reviewed: 02/08/2014 [employer] Patient Information 2015 [employer]. This information is not intended to replace advice given to you by your health care provider. Make sure you discuss any questions you have with your health care provider.  

## 2014-06-29 LAB — CULTURE, GROUP A STREP

## 2014-10-08 ENCOUNTER — Emergency Department (HOSPITAL_COMMUNITY)
Admission: EM | Admit: 2014-10-08 | Discharge: 2014-10-08 | Disposition: A | Discharge status: Home / Self Care | Payer: Self-pay | Attending: Emergency Medicine | Admitting: Emergency Medicine

## 2014-10-08 ENCOUNTER — Encounter (HOSPITAL_COMMUNITY): Payer: Self-pay | Admitting: Emergency Medicine

## 2014-10-08 ENCOUNTER — Emergency Department (HOSPITAL_COMMUNITY): Payer: Self-pay

## 2014-10-08 DIAGNOSIS — Z79899 Other long term (current) drug therapy: Secondary | ICD-10-CM | POA: Insufficient documentation

## 2014-10-08 DIAGNOSIS — Z872 Personal history of diseases of the skin and subcutaneous tissue: Secondary | ICD-10-CM | POA: Insufficient documentation

## 2014-10-08 DIAGNOSIS — Z8659 Personal history of other mental and behavioral disorders: Secondary | ICD-10-CM | POA: Insufficient documentation

## 2014-10-08 DIAGNOSIS — Z7952 Long term (current) use of systemic steroids: Secondary | ICD-10-CM | POA: Insufficient documentation

## 2014-10-08 DIAGNOSIS — S93602A Unspecified sprain of left foot, initial encounter: Secondary | ICD-10-CM | POA: Insufficient documentation

## 2014-10-08 DIAGNOSIS — W108XXA Fall (on) (from) other stairs and steps, initial encounter: Secondary | ICD-10-CM | POA: Insufficient documentation

## 2014-10-08 DIAGNOSIS — Y998 Other external cause status: Secondary | ICD-10-CM | POA: Insufficient documentation

## 2014-10-08 DIAGNOSIS — Z720 Tobacco use: Secondary | ICD-10-CM | POA: Insufficient documentation

## 2014-10-08 DIAGNOSIS — Y9289 Other specified places as the place of occurrence of the external cause: Secondary | ICD-10-CM | POA: Insufficient documentation

## 2014-10-08 DIAGNOSIS — Y9301 Activity, walking, marching and hiking: Secondary | ICD-10-CM | POA: Insufficient documentation

## 2014-10-08 MED ORDER — IBUPROFEN 800 MG PO TABS: 800.0000 mg | ORAL_TABLET | Freq: Once | ORAL | Status: DC

## 2014-10-08 MED ORDER — IBUPROFEN 800 MG PO TABS: 800.0000 mg | ORAL_TABLET | Freq: Three times a day (TID) | ORAL | Status: DC

## 2014-10-08 NOTE — ED Notes (Signed)
Pt arrived to the Ed with left ankle pain.  Pt fell while wearing high heeled shoes.  Pt twisted the ankle.  Pt states she is having difficulty standing on ankle.

## 2014-10-08 NOTE — Discharge Instructions (Signed)
°Cryotherapy °Cryotherapy is when you put ice on your injury. Ice helps lessen pain and puffiness (swelling) after an injury. Ice works the best when you start using it in the first 24 to 48 hours after an injury. °HOME CARE °· Put a dry or damp towel between the ice pack and your skin. °· You may press gently on the ice pack. °· Leave the ice on for no more than 10 to 20 minutes at a time. °· Check your skin after 5 minutes to make sure your skin is okay. °· Rest at least 20 minutes between ice pack uses. °· Stop using ice when your skin loses feeling (numbness). °· Do not use ice on someone who cannot tell you when it hurts. This includes small children and people with memory problems (dementia). °GET HELP RIGHT AWAY IF: °· You have white spots on your skin. °· Your skin turns blue or pale. °· Your skin feels waxy or hard. °· Your puffiness gets worse. °MAKE SURE YOU:  °· Understand these instructions. °· Will watch your condition. °· Will get help right away if you are not doing well or get worse. °Document Released: 04/21/2008 Document Revised: 01/26/2012 Document Reviewed: 06/26/2011 °[employer]® Patient Information ©2015 [employer]. This information is not intended to replace advice given to you by your health care provider. Make sure you discuss any questions you have with your health care provider. °Foot Sprain °The muscles and cord like structures which attach muscle to bone (tendons) that surround the feet are made up of units. A foot sprain can occur at the weakest spot in any of these units. This condition is most often caused by injury to or overuse of the foot, as from playing contact sports, or aggravating a previous injury, or from poor conditioning, or obesity. °SYMPTOMS °· Pain with movement of the foot. °· Tenderness and swelling at the injury site. °· Loss of strength is present in moderate or severe sprains. °THE THREE GRADES OR SEVERITY OF FOOT SPRAIN ARE: °· Mild (Grade I): Slightly pulled  muscle without tearing of muscle or tendon fibers or loss of strength. °· Moderate (Grade II): Tearing of fibers in a muscle, tendon, or at the attachment to bone, with small decrease in strength. °· Severe (Grade III): Rupture of the muscle-tendon-bone attachment, with separation of fibers. Severe sprain requires surgical repair. Often repeating (chronic) sprains are caused by overuse. Sudden (acute) sprains are caused by direct injury or over-use. °DIAGNOSIS  °Diagnosis of this condition is usually by your own observation. If problems continue, a caregiver may be required for further evaluation and treatment. X-rays may be required to make sure there are not breaks in the bones (fractures) present. Continued problems may require physical therapy for treatment. °PREVENTION °· Use strength and conditioning exercises appropriate for your sport. °· Warm up properly prior to working out. °· Use athletic shoes that are made for the sport you are participating in. °· Allow adequate time for healing. Early return to activities makes repeat injury more likely, and can lead to an unstable arthritic foot that can result in prolonged disability. Mild sprains generally heal in 3 to 10 days, with moderate and severe sprains taking 2 to 10 weeks. Your caregiver can help you determine the proper time required for healing. °HOME CARE INSTRUCTIONS  °· Apply ice to the injury for 15-20 minutes, 03-04 times per day. Put the ice in a plastic bag and place a towel between the bag of ice and your skin. °·   An elastic wrap (like an Ace bandage) may be used to keep swelling down. °· Keep foot above the level of the heart, or at least raised on a footstool, when swelling and pain are present. °· Try to avoid use other than gentle range of motion while the foot is painful. Do not resume use until instructed by your caregiver. Then begin use gradually, not increasing use to the point of pain. If pain does develop, decrease use and continue  the above measures, gradually increasing activities that do not cause discomfort, until you gradually achieve normal use. °· Use crutches if and as instructed, and for the length of time instructed. °· Keep injured foot and ankle wrapped between treatments. °· Massage foot and ankle for comfort and to keep swelling down. Massage from the toes up towards the knee. °· Only take over-the-counter or prescription medicines for pain, discomfort, or fever as directed by your caregiver. °SEEK IMMEDIATE MEDICAL CARE IF:  °· Your pain and swelling increase, or pain is not controlled with medications. °· You have loss of feeling in your foot or your foot turns cold or blue. °· You develop new, unexplained symptoms, or an increase of the symptoms that brought you to your caregiver. °MAKE SURE YOU:  °· Understand these instructions. °· Will watch your condition. °· Will get help right away if you are not doing well or get worse. °Document Released: 04/25/2002 Document Revised: 01/26/2012 Document Reviewed: 06/22/2008 °[employer]® Patient Information ©2015 [employer]. This information is not intended to replace advice given to you by your health care provider. Make sure you discuss any questions you have with your health care provider. ° °

## 2014-10-08 NOTE — ED Provider Notes (Signed)
CSN: [id number]     Arrival date & time 10/08/14  [id number] History   First MD Initiated Contact with Patient 10/08/14 [phone]     Chief Complaint  Patient presents with  . Ankle Pain     (Consider location/radiation/quality/duration/timing/severity/associated sxs/prior Treatment) Patient is a 18 y.o. female presenting with ankle pain. The history is provided by the patient. No language interpreter was used.  Ankle Pain Location:  Ankle and foot Injury: yes   Mechanism of injury: fall   Fall:    Entrapped after fall: no   Ankle location:  L ankle Foot location:  L foot Chronicity:  New Dislocation: no   Foreign body present:  No foreign bodies Associated symptoms: no fever   Associated symptoms comment:  She fell while walking down a flight of steps while wearing 6" high heels causing foot inversion injury. No other complaint of pain.    Past Medical History  Diagnosis Date  . Eczema   . Polysubstance abuse     cocaine marijuana  . Drug overdose, intentional   . ODD (oppositional defiant disorder)    Past Surgical History  Procedure Laterality Date  . Wisdom tooth extraction     History reviewed. No pertinent family history. History  Substance Use Topics  . Smoking status: Current Every Day Smoker -- 0.33 packs/day for 3 years    Types: Cigarettes  . Smokeless tobacco: Not on file  . Alcohol Use: Yes     Comment: 2 x /month   OB History    No data available     Review of Systems  Constitutional: Negative for fever and chills.  Musculoskeletal:       See HPI.  Skin: Negative.   Neurological: Negative.  Negative for numbness.      Allergies  Review of patient's allergies indicates no known allergies.  Home Medications   Prior to Admission medications   Medication Sig Start Date End Date Taking? Authorizing Provider  azithromycin (ZITHROMAX Z-PAK) 250 MG tablet Use as directed 06/28/14   Graylon GoodZachary H Baker, PA-C  cetirizine-pseudoephedrine (ZYRTEC-D) 5-120 MG  per tablet Take 1 tablet by mouth 2 (two) times daily.    Historical Provider, MD  desloratadine-pseudoephedrine (CLARINEX-D 12 HOUR) 2.5-120 MG per tablet Take 1 tablet by mouth 2 (two) times daily. 06/27/14   Kaitlyn Szekalski, PA-C  ferrous sulfate 325 (65 FE) MG tablet Take 1 tablet (325 mg total) by mouth daily. 06/27/14   Kaitlyn Szekalski, PA-C  guaiFENesin-codeine 100-10 MG/5ML syrup Take 5 mLs by mouth every 4 (four) hours as needed for cough. 06/28/14   Graylon GoodZachary H Baker, PA-C  ibuprofen (ADVIL,MOTRIN) 800 MG tablet Take 1 tablet (800 mg total) by mouth 3 (three) times daily. 10/08/14   Nitya Cauthon A Bayne Fosnaugh, PA-C  loratadine-pseudoephedrine (CLARITIN-D 12 HOUR) 5-120 MG per tablet Take 1 tablet by mouth 2 (two) times daily. 06/27/14   Kaitlyn Szekalski, PA-C  omeprazole (PRILOSEC OTC) 20 MG tablet Take 20 mg by mouth daily.    Historical Provider, MD  predniSONE (DELTASONE) 50 MG tablet Take 1 tablet (50 mg total) by mouth daily with breakfast. 06/28/14   Graylon GoodZachary H Baker, PA-C   BP 133/73 mmHg  Pulse 75  Temp(Src) 97.7 F (36.5 C) (Oral)  Resp 19  Ht 5\' 6"  (1.676 m)  Wt 135 lb (61.236 kg)  BMI 21.80 kg/m2  SpO2 100%  LMP 10/04/2014 Physical Exam  Constitutional: She is oriented to person, place, and time. She appears well-developed and well-nourished.  Neck:  Normal range of motion.  Pulmonary/Chest: Effort normal.  Musculoskeletal:  Left ankle and foot have no visible swelling, discoloration or bony deformity. Ankle joint stable. Tender most over proximal dorsal forefoot. FROM.  Neurological: She is alert and oriented to person, place, and time.  Skin: Skin is warm and dry.    ED Course  Procedures (including critical care time) Labs Review Labs Reviewed - No data to display  Imaging Review Dg Ankle Complete Left  10/08/2014   CLINICAL DATA:  Initial evaluation for acute ankle pain status post fall.  EXAM: LEFT ANKLE COMPLETE - 3+ VIEW  COMPARISON:  None.  FINDINGS: There is no  evidence of fracture, dislocation, or joint effusion. There is no evidence of arthropathy or other focal bone abnormality. Soft tissues are unremarkable.  IMPRESSION: Negative.   Electronically Signed   By: Rise MuBenjamin  McClintock M.D.   On: 10/08/2014 04:20   Dg Foot Complete Left  10/08/2014   CLINICAL DATA:  Initial evaluation for acute left foot pain status post fall.  EXAM: LEFT FOOT - COMPLETE 3+ VIEW  COMPARISON:  None.  FINDINGS: There is no evidence of fracture or dislocation. There is no evidence of arthropathy or other focal bone abnormality. Soft tissues are unremarkable.  IMPRESSION: Negative.   Electronically Signed   By: Rise MuBenjamin  McClintock M.D.   On: 10/08/2014 04:25     EKG Interpretation None      MDM   Final diagnoses:  Foot sprain, left, initial encounter    1. Left foot sprain  Imaging negative for fracture. Crutches offered, patient declined. 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     Arnoldo HookerShari A Rosaisela Jamroz, PA-C 10/08/14 [id number]  Loren Raceravid Yelverton, MD 10/08/14 [id number]

## 2015-03-26 IMAGING — CR DG ANKLE COMPLETE 3+V*L*
3 series · 3 of 3 positions shown · non-contrast
Comparison: None.

CLINICAL DATA: Initial evaluation for acute ankle pain status post
fall.

EXAM:
LEFT ANKLE COMPLETE - 3+ VIEW

[x ankle ap left]
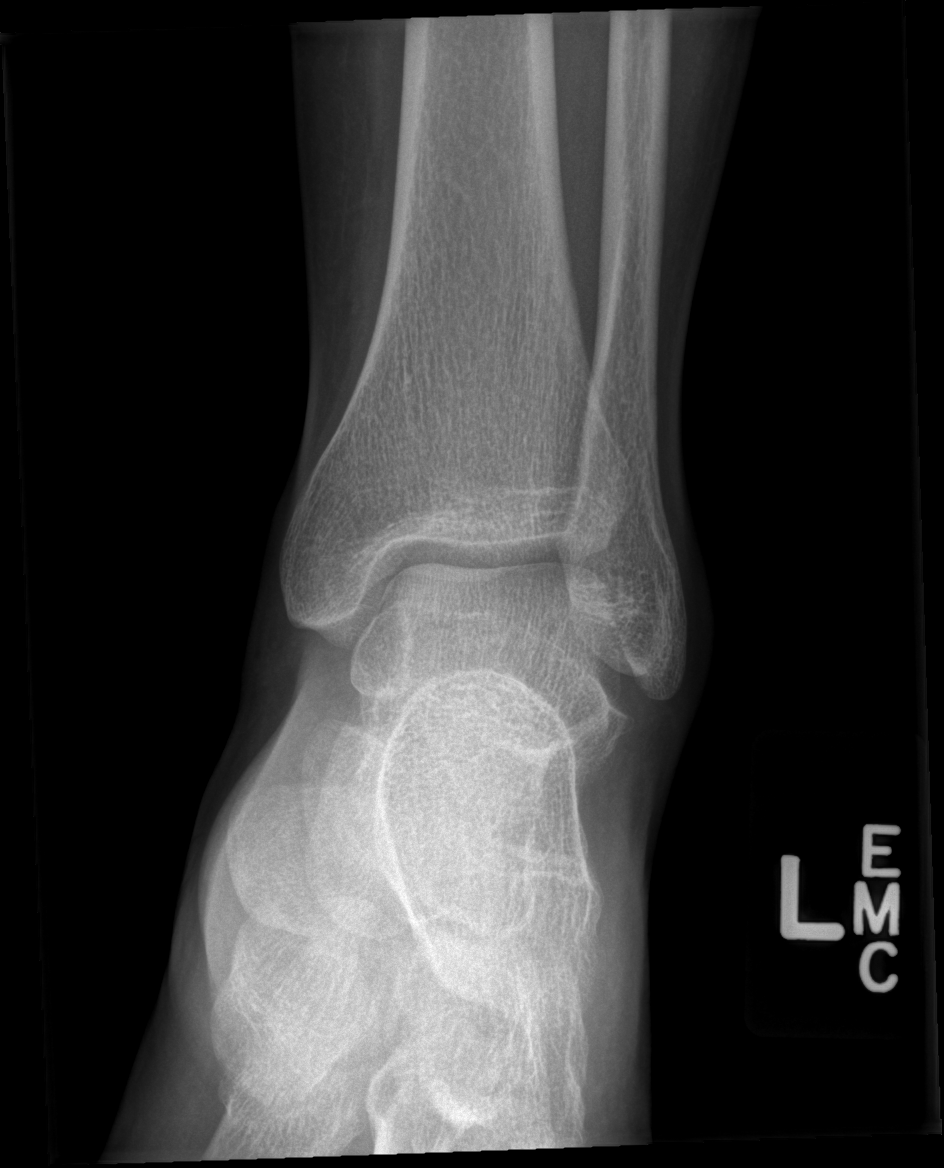

[x ankle obl left]
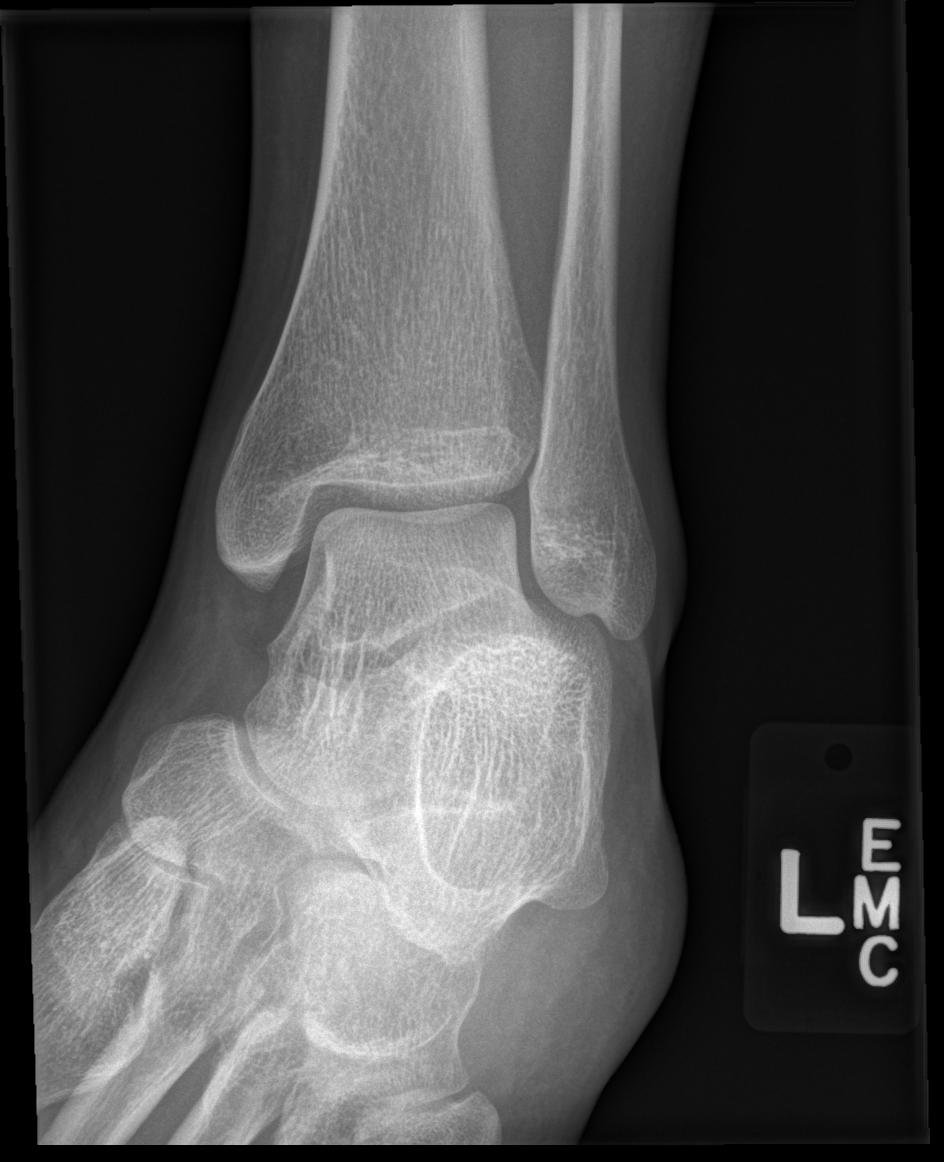

[x ankle lat left]
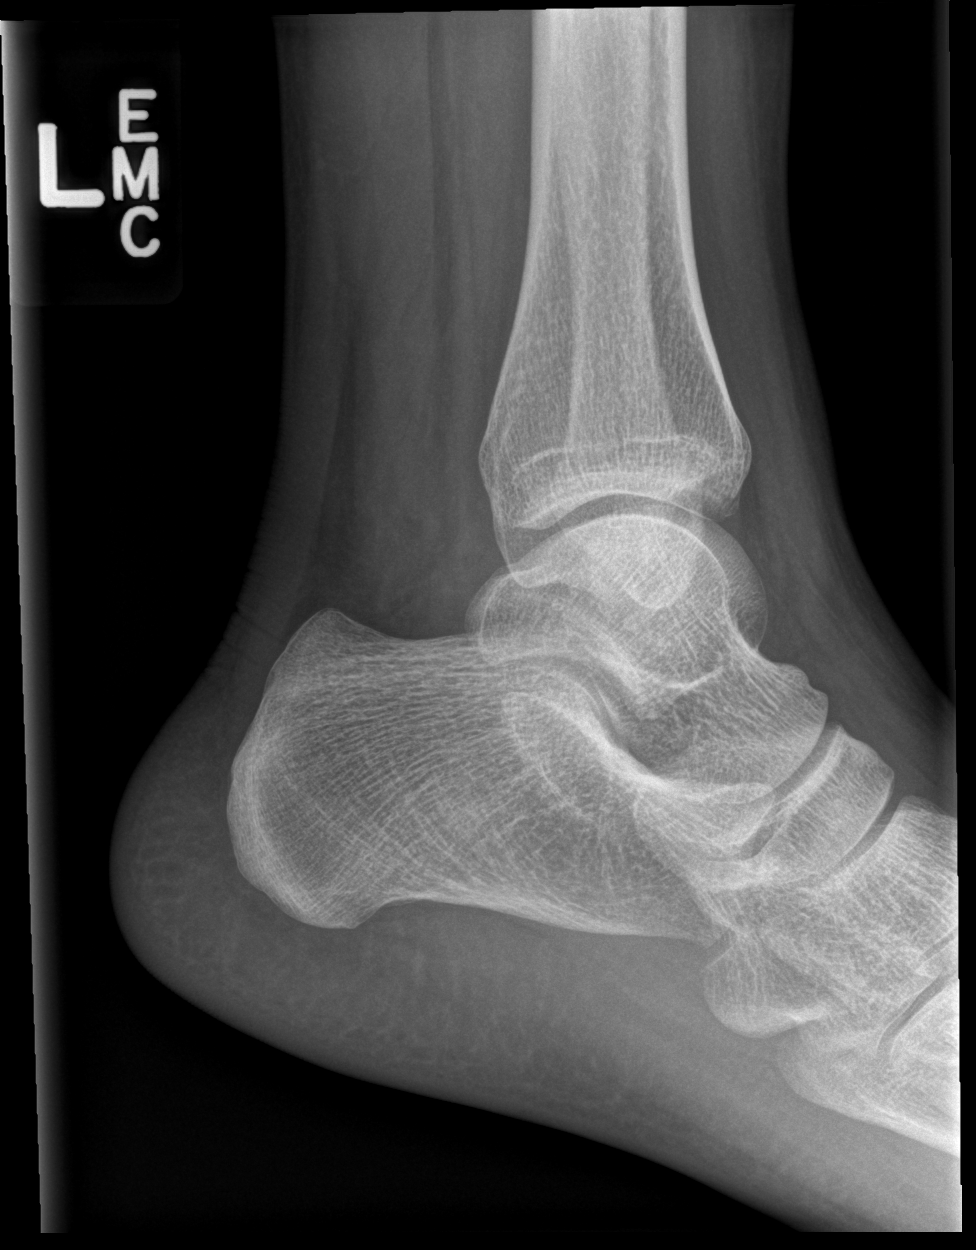

[3 of 3 positions shown; findings below may reference images not displayed]

FINDINGS: There is no evidence of fracture, dislocation, or joint effusion.
There is no evidence of arthropathy or other focal bone abnormality.
Soft tissues are unremarkable.
IMPRESSION: Negative.

## 2015-07-27 ENCOUNTER — Emergency Department (INDEPENDENT_AMBULATORY_CARE_PROVIDER_SITE_OTHER)
Admission: EM | Admit: 2015-07-27 | Discharge: 2015-07-27 | Disposition: A | Discharge status: Home / Self Care | Payer: Self-pay | Source: Home / Self Care | Attending: Emergency Medicine | Admitting: Emergency Medicine

## 2015-07-27 ENCOUNTER — Encounter (HOSPITAL_COMMUNITY): Payer: Self-pay | Admitting: *Deleted

## 2015-07-27 DIAGNOSIS — R531 Weakness: Secondary | ICD-10-CM

## 2015-07-27 LAB — POCT I-STAT, CHEM 8
BUN: <3 mg/dL — ABNORMAL LOW (ref 6–20)
Calcium, Ion: 1.21 mmol/L (ref 1.12–1.23)
Chloride: 104 mmol/L (ref 101–111)
Creatinine, Ser: 0.7 mg/dL (ref 0.44–1.00)
Glucose, Bld: 81 mg/dL (ref 65–99)
HEMATOCRIT: 39 % (ref 36.0–46.0)
HEMOGLOBIN: 13.3 g/dL (ref 12.0–15.0)
POTASSIUM: 3.7 mmol/L (ref 3.5–5.1)
Sodium: 142 mmol/L (ref 135–145)
TCO2: 26 mmol/L (ref 0–100)

## 2015-07-27 LAB — POCT URINALYSIS DIP (DEVICE)
BILIRUBIN URINE: NEGATIVE
GLUCOSE, UA: NEGATIVE mg/dL
Ketones, ur: NEGATIVE mg/dL
LEUKOCYTES UA: NEGATIVE
Nitrite: NEGATIVE
Protein, ur: 100 mg/dL — AB
Specific Gravity, Urine: 1.025 (ref 1.005–1.030)
Urobilinogen, UA: 0.2 mg/dL (ref 0.0–1.0)
pH: 7 (ref 5.0–8.0)

## 2015-07-27 LAB — POCT PREGNANCY, URINE: PREG TEST UR: NEGATIVE

## 2015-07-27 NOTE — Discharge Instructions (Signed)

## 2015-07-27 NOTE — ED Notes (Signed)
Pt  Became  tearfull      When  Pt   Being  Discharged  Pt  Reports  Something is  Wrong  With  Me       i  Know  It   -    Amanda Khan  Spoke  With patient  Again

## 2015-07-27 NOTE — ED Notes (Signed)
Pt  Reports     Symptoms     Of        Weakness      And  Dizzy   With  History   Of     Anemia             And excessive  Vaginal  Bleeding             Symptoms  X 3-4    Days

## 2015-07-27 NOTE — ED Provider Notes (Signed)
CSN: [id number]     Arrival date & time 07/27/15  1527 History   First MD Initiated Contact with Patient 07/27/15 1646     Chief Complaint  Patient presents with  . Weakness   (Consider location/radiation/quality/duration/timing/severity/associated sxs/prior Treatment) HPI Comments: 19 year old female presents with a she complaint of weakness. She states that her legs feel weak. This started approximately 3-4 days ago. She states that she gets weak when she is driving he can drive referral much longer than 10 minutes. When walking from one building to another at school she gets weak at that time. Denies exertional dyspnea. Denies chest pain. Denies vaginal discharge or bleeding although she states she is currently on her menses. She states her menses is irregular and occurs about once every 18 days. The first few days or heavy and then becomes lighter. This is a chronic issue for several years. She denies specific pain. She states she is sleeping more than usual. Patient is a 19 y.o. female presenting with weakness.  Weakness This is a new problem. The current episode started more than 2 days ago. The problem occurs constantly. The problem has not changed since onset.Pertinent negatives include no chest pain, no abdominal pain, no headaches and no shortness of breath. The symptoms are aggravated by walking. She has tried nothing for the symptoms.    Past Medical History  Diagnosis Date  . Eczema   . Polysubstance abuse     cocaine marijuana  . Drug overdose, intentional   . ODD (oppositional defiant disorder)    Past Surgical History  Procedure Laterality Date  . Wisdom tooth extraction     History reviewed. No pertinent family history. Social History  Substance Use Topics  . Smoking status: Current Every Day Smoker -- 0.33 packs/day for 3 years    Types: Cigarettes  . Smokeless tobacco: None  . Alcohol Use: Yes     Comment: 2 x /month   OB History    No data available      Review of Systems  Constitutional: Positive for activity change and fatigue. Negative for fever.  HENT: Negative.   Eyes: Negative.   Respiratory: Negative.  Negative for shortness of breath.   Cardiovascular: Negative for chest pain and leg swelling.  Gastrointestinal: Negative.  Negative for abdominal pain.  Genitourinary: Positive for vaginal bleeding and menstrual problem. Negative for dysuria, frequency and vaginal discharge.  Musculoskeletal: Negative.   Skin: Negative.   Neurological: Positive for weakness. Negative for syncope and headaches.  All other systems reviewed and are negative.   Allergies  Review of patient's allergies indicates no known allergies.  Home Medications   Prior to Admission medications   Medication Sig Start Date End Date Taking? Authorizing Provider  azithromycin (ZITHROMAX Z-PAK) 250 MG tablet Use as directed 06/28/14   Graylon Good, PA-C  cetirizine-pseudoephedrine (ZYRTEC-D) 5-120 MG per tablet Take 1 tablet by mouth 2 (two) times daily.    Historical Provider, MD  desloratadine-pseudoephedrine (CLARINEX-D 12 HOUR) 2.5-120 MG per tablet Take 1 tablet by mouth 2 (two) times daily. 06/27/14   Kaitlyn Szekalski, PA-C  ferrous sulfate 325 (65 FE) MG tablet Take 1 tablet (325 mg total) by mouth daily. 06/27/14   Kaitlyn Szekalski, PA-C  guaiFENesin-codeine 100-10 MG/5ML syrup Take 5 mLs by mouth every 4 (four) hours as needed for cough. 06/28/14   Graylon Good, PA-C  ibuprofen (ADVIL,MOTRIN) 800 MG tablet Take 1 tablet (800 mg total) by mouth 3 (three) times daily. 10/08/14  Elpidio Anis, PA-C  loratadine-pseudoephedrine (CLARITIN-D 12 HOUR) 5-120 MG per tablet Take 1 tablet by mouth 2 (two) times daily. 06/27/14   Kaitlyn Szekalski, PA-C  omeprazole (PRILOSEC OTC) 20 MG tablet Take 20 mg by mouth daily.    Historical Provider, MD  predniSONE (DELTASONE) 50 MG tablet Take 1 tablet (50 mg total) by mouth daily with breakfast. 06/28/14   Graylon Good,  PA-C   Meds Ordered and Administered this Visit  Medications - No data to display  BP 122/82 mmHg  Pulse 96  Temp(Src) 98.5 F (36.9 C) (Oral)  Resp 16  SpO2 100%  LMP 07/27/2015 No data found.   Physical Exam  Constitutional: She is oriented to person, place, and time. She appears well-developed and well-nourished. No distress.  HENT:  Head: Normocephalic and atraumatic.  Mouth/Throat: Oropharynx is clear and moist. No oropharyngeal exudate.  Eyes: EOM are normal. Pupils are equal, round, and reactive to light.  Neck: Normal range of motion. Neck supple.  Cardiovascular: Normal rate and normal heart sounds.   Pulmonary/Chest: Effort normal and breath sounds normal. No respiratory distress. She has no wheezes.  Abdominal: Soft. There is no tenderness.  Musculoskeletal: Normal range of motion. She exhibits no edema or tenderness.  Strength testing of the upper and lower extremities reveals symmetry with 5 over 5. She is able to squat and raised from the floor without assistance. No focal weaknesses found in her extremities.  Neurological: She is alert and oriented to person, place, and time. She has normal strength. No cranial nerve deficit or sensory deficit. She exhibits normal muscle tone. She displays a negative Romberg sign. Coordination and gait normal.  Heel to toe normal.  Skin: Skin is warm and dry.  Psychiatric: She has a normal mood and affect.  Nursing note and vitals reviewed.   ED Course  Procedures (including critical care time)  Labs Review Labs Reviewed  POCT I-STAT, CHEM 8 - Abnormal; Notable for the following:    BUN <3 (*)    All other components within normal limits  POCT URINALYSIS DIP (DEVICE) - Abnormal; Notable for the following:    Hgb urine dipstick TRACE (*)    Protein, ur 100 (*)    All other components within normal limits  POCT PREGNANCY, URINE   Results for orders placed or performed during the hospital encounter of 07/27/15  I-STAT,  chem 8  Result Value Ref Range   Sodium 142 135 - 145 mmol/L   Potassium 3.7 3.5 - 5.1 mmol/L   Chloride 104 101 - 111 mmol/L   BUN <3 (L) 6 - 20 mg/dL   Creatinine, Ser 1.61 0.44 - 1.00 mg/dL   Glucose, Bld 81 65 - 99 mg/dL   Calcium, Ion 0.96 0.45 - 1.23 mmol/L   TCO2 26 0 - 100 mmol/L   Hemoglobin 13.3 12.0 - 15.0 g/dL   HCT 40.9 81.1 - 91.4 %  POCT urinalysis dip (device)  Result Value Ref Range   Glucose, UA NEGATIVE NEGATIVE mg/dL   Bilirubin Urine NEGATIVE NEGATIVE   Ketones, ur NEGATIVE NEGATIVE mg/dL   Specific Gravity, Urine 1.025 1.005 - 1.030   Hgb urine dipstick TRACE (A) NEGATIVE   pH 7.0 5.0 - 8.0   Protein, ur 100 (A) NEGATIVE mg/dL   Urobilinogen, UA 0.2 0.0 - 1.0 mg/dL   Nitrite NEGATIVE NEGATIVE   Leukocytes, UA NEGATIVE NEGATIVE  Pregnancy, urine POC  Result Value Ref Range   Preg Test, Ur NEGATIVE NEGATIVE  Imaging Review No results found.   Visual Acuity Review  Right Eye Distance:   Left Eye Distance:   Bilateral Distance:    Right Eye Near:   Left Eye Near:    Bilateral Near:         MDM   1. Weakness generalized     Physical exam is unremarkable. Patient demonstrates normal physical strength. I-STAT shows small amount of blood that she is currently having menses. Is otherwise normal. She is not anemic. Electrolytes are normal. Urinalysis without signs of infection. Pregnancy negative. Recommend she follow-up with a primary care physician. She may also utilize one of the other local urgent cares who are able to perform additional testing.    Hayden Rasmussen, NP 07/27/15 1750

## 2015-12-21 ENCOUNTER — Other Ambulatory Visit (HOSPITAL_COMMUNITY)
Admission: RE | Admit: 2015-12-21 | Discharge: 2015-12-21 | Disposition: A | Discharge status: Home / Self Care | Payer: Self-pay | Source: Ambulatory Visit | Attending: Family | Admitting: Family

## 2015-12-21 ENCOUNTER — Other Ambulatory Visit: Payer: Self-pay

## 2015-12-21 DIAGNOSIS — N760 Acute vaginitis: Secondary | ICD-10-CM | POA: Insufficient documentation

## 2015-12-21 DIAGNOSIS — Z113 Encounter for screening for infections with a predominantly sexual mode of transmission: Secondary | ICD-10-CM | POA: Insufficient documentation

## 2015-12-21 DIAGNOSIS — Z01419 Encounter for gynecological examination (general) (routine) without abnormal findings: Secondary | ICD-10-CM | POA: Insufficient documentation

## 2015-12-25 LAB — CYTOLOGY - PAP

## 2016-01-06 ENCOUNTER — Encounter (HOSPITAL_COMMUNITY): Payer: Self-pay | Admitting: Emergency Medicine

## 2016-01-06 ENCOUNTER — Emergency Department (HOSPITAL_COMMUNITY)
Admission: EM | Admit: 2016-01-06 | Discharge: 2016-01-06 | Disposition: A | Discharge status: Home / Self Care | Payer: Self-pay | Attending: Emergency Medicine | Admitting: Emergency Medicine

## 2016-01-06 DIAGNOSIS — F17210 Nicotine dependence, cigarettes, uncomplicated: Secondary | ICD-10-CM | POA: Insufficient documentation

## 2016-01-06 DIAGNOSIS — T50905A Adverse effect of unspecified drugs, medicaments and biological substances, initial encounter: Secondary | ICD-10-CM

## 2016-01-06 DIAGNOSIS — Z793 Long term (current) use of hormonal contraceptives: Secondary | ICD-10-CM | POA: Insufficient documentation

## 2016-01-06 DIAGNOSIS — F1410 Cocaine abuse, uncomplicated: Secondary | ICD-10-CM | POA: Insufficient documentation

## 2016-01-06 DIAGNOSIS — F1010 Alcohol abuse, uncomplicated: Secondary | ICD-10-CM | POA: Insufficient documentation

## 2016-01-06 DIAGNOSIS — T373X5A Adverse effect of other antiprotozoal drugs, initial encounter: Secondary | ICD-10-CM | POA: Insufficient documentation

## 2016-01-06 DIAGNOSIS — R1084 Generalized abdominal pain: Secondary | ICD-10-CM | POA: Insufficient documentation

## 2016-01-06 DIAGNOSIS — R112 Nausea with vomiting, unspecified: Secondary | ICD-10-CM | POA: Insufficient documentation

## 2016-01-06 DIAGNOSIS — Z872 Personal history of diseases of the skin and subcutaneous tissue: Secondary | ICD-10-CM | POA: Insufficient documentation

## 2016-01-06 DIAGNOSIS — R0789 Other chest pain: Secondary | ICD-10-CM | POA: Insufficient documentation

## 2016-01-06 LAB — CBC WITH DIFFERENTIAL/PLATELET
Basophils Absolute: 0 10*3/uL (ref 0.0–0.1)
Basophils Relative: 0 %
Eosinophils Absolute: 0 10*3/uL (ref 0.0–0.7)
Eosinophils Relative: 0 %
HEMATOCRIT: 37.2 % (ref 36.0–46.0)
Hemoglobin: 12.9 g/dL (ref 12.0–15.0)
LYMPHS PCT: 18 %
Lymphs Abs: 1.3 10*3/uL (ref 0.7–4.0)
MCH: 31.1 pg (ref 26.0–34.0)
MCHC: 34.7 g/dL (ref 30.0–36.0)
MCV: 89.6 fL (ref 78.0–100.0)
MONO ABS: 0.4 10*3/uL (ref 0.1–1.0)
MONOS PCT: 5 %
Neutro Abs: 5.4 10*3/uL (ref 1.7–7.7)
Neutrophils Relative %: 77 %
Platelets: 335 10*3/uL (ref 150–400)
RBC: 4.15 MIL/uL (ref 3.87–5.11)
RDW: 13.7 % (ref 11.5–15.5)
WBC: 7.1 10*3/uL (ref 4.0–10.5)

## 2016-01-06 LAB — BASIC METABOLIC PANEL
ANION GAP: 13 (ref 5–15)
BUN: 9 mg/dL (ref 6–20)
CALCIUM: 9.2 mg/dL (ref 8.9–10.3)
CO2: 18 mmol/L — AB (ref 22–32)
Chloride: 110 mmol/L (ref 101–111)
Creatinine, Ser: 0.55 mg/dL (ref 0.44–1.00)
GFR calc Af Amer: >60 mL/min (ref >60)
GFR calc non Af Amer: >60 mL/min (ref >60)
GLUCOSE: 125 mg/dL — AB (ref 65–99)
Potassium: 3.7 mmol/L (ref 3.5–5.1)
Sodium: 141 mmol/L (ref 135–145)

## 2016-01-06 LAB — HEPATIC FUNCTION PANEL
ALBUMIN: 4.4 g/dL (ref 3.5–5.0)
ALK PHOS: 48 U/L (ref 38–126)
ALT: 14 U/L (ref 14–54)
AST: 27 U/L (ref 15–41)
BILIRUBIN DIRECT: 0.2 mg/dL (ref 0.1–0.5)
BILIRUBIN TOTAL: 0.3 mg/dL (ref 0.3–1.2)
Indirect Bilirubin: 0.1 mg/dL — ABNORMAL LOW (ref 0.3–0.9)
Total Protein: 8.4 g/dL — ABNORMAL HIGH (ref 6.5–8.1)

## 2016-01-06 LAB — LIPASE, BLOOD: Lipase: 21 U/L (ref 11–51)

## 2016-01-06 MED ORDER — GI COCKTAIL ~~LOC~~
30.0000 mL | Freq: Once | ORAL | Status: AC
  Administered 2016-01-06: 30 mL via ORAL
  Filled 2016-01-06: qty 30

## 2016-01-06 MED ORDER — HYDROXYZINE HCL 25 MG PO TABS
25.0000 mg | ORAL_TABLET | Freq: Once | ORAL | Status: AC
  Administered 2016-01-06: 25 mg via ORAL
  Filled 2016-01-06: qty 1

## 2016-01-06 MED ORDER — ONDANSETRON HCL 4 MG/2ML IJ SOLN
4.0000 mg | Freq: Once | INTRAMUSCULAR | Status: AC
  Administered 2016-01-06: 4 mg via INTRAVENOUS
  Filled 2016-01-06: qty 2

## 2016-01-06 MED ORDER — DICYCLOMINE HCL 20 MG PO TABS: 20.0000 mg | ORAL_TABLET | Freq: Two times a day (BID) | ORAL | Status: AC

## 2016-01-06 MED ORDER — SODIUM CHLORIDE 0.9 % IV BOLUS (SEPSIS)
1000.0000 mL | Freq: Once | INTRAVENOUS | Status: AC
  Administered 2016-01-06: 1000 mL via INTRAVENOUS

## 2016-01-06 MED ORDER — MORPHINE SULFATE (PF) 4 MG/ML IV SOLN
4.0000 mg | Freq: Once | INTRAVENOUS | Status: AC
  Administered 2016-01-06: 4 mg via INTRAVENOUS
  Filled 2016-01-06: qty 1

## 2016-01-06 MED ORDER — ONDANSETRON 4 MG PO TBDP: 4.0000 mg | ORAL_TABLET | Freq: Three times a day (TID) | ORAL | Status: AC | PRN

## 2016-01-06 NOTE — Discharge Instructions (Signed)
Your labs today were normal. You likely had a reaction between alcohol and metronidazole. You may resume the metronidazole tomorrow. Please follow up with your primary care provider. I will give you prescriptions to help with your nausea and pain.

## 2016-01-06 NOTE — ED Provider Notes (Signed)
CSN: [id number]     Arrival date & time 01/06/16  1034 History   First MD Initiated Contact with Patient 01/06/16 1058     Chief Complaint  Patient presents with  . Chest Pain  . Abdominal Pain  . Emesis  . Diarrhea  . Anxiety    HPI   Adiah is an 20 y.o. female with history of polysubstance abuse and ODD who presents to the ED for evaluation of abdominal pain, n/v/d. She states that she has been taking Flagyl for BV for the past few days. She states that last night she did cocaine and drank EtOH. She is unsure how much she drank but states it was "a lot". She states she woke up this morning around 3 AM extremely nauseated and started having multiple episodes of NBNB emesis. She states she thought she was hungover. States then she started developing progressively increasing severe abdominal pain. States that she continued vomiting a couple times an and hour and has been throwing up so much her chest hurts all over. STates her throat is burning. She states her abdomen hurts so bad she feels like she is dying. Denies fever, chills. Denies diarrhea. Denies urinary symptoms. She states she has not tried anything for the pain. She denies SOB, LOC.  Past Medical History  Diagnosis Date  . Eczema   . Polysubstance abuse     cocaine marijuana  . Drug overdose, intentional (HCC)   . ODD (oppositional defiant disorder)    Past Surgical History  Procedure Laterality Date  . Wisdom tooth extraction     History reviewed. No pertinent family history. Social History  Substance Use Topics  . Smoking status: Current Every Day Smoker -- 0.33 packs/day for 3 years    Types: Cigarettes  . Smokeless tobacco: None  . Alcohol Use: Yes     Comment: 2 x /month   OB History    No data available     Review of Systems  All other systems reviewed and are negative.     Allergies  Review of patient's allergies indicates no known allergies.  Home Medications   Prior to Admission medications    Medication Sig Start Date End Date Taking? Authorizing Provider  metroNIDAZOLE (FLAGYL) 500 MG tablet Take 500 mg by mouth 2 (two) times daily. For 7 days 01/04/16  Yes Historical Provider, MD  Norgestimate-Ethinyl Estradiol Triphasic (TRINESSA, 28,) 0.18/0.215/0.25 MG-35 MCG tablet Take 1 tablet by mouth daily.   Yes Historical Provider, MD  ferrous sulfate 325 (65 FE) MG tablet Take 1 tablet (325 mg total) by mouth daily. Patient not taking: Reported on 01/06/2016 06/27/14   Kaitlyn Szekalski, PA-C   BP 124/94 mmHg  Pulse 103  Temp(Src) 97.6 F (36.4 C) (Oral)  Resp 28  SpO2 100% Physical Exam  Constitutional: She is oriented to person, place, and time.  Tearful, screaming at times, clutching stomach  HENT:  Right Ear: External ear normal.  Left Ear: External ear normal.  Nose: Nose normal.  Mouth/Throat: Oropharynx is clear and moist. No oropharyngeal exudate.  Eyes: Conjunctivae and EOM are normal. Pupils are equal, round, and reactive to light.  Neck: Normal range of motion. Neck supple.  Cardiovascular: Normal rate, regular rhythm, normal heart sounds and intact distal pulses.   Pulmonary/Chest: Effort normal and breath sounds normal. No respiratory distress. She has no wheezes. She exhibits no tenderness.  Anterior chest wall diffusely TTP  Abdominal: Soft. Bowel sounds are normal. She exhibits no distension. There is  generalized tenderness. There is no rebound and no guarding.  Musculoskeletal: She exhibits no edema.  Neurological: She is alert and oriented to person, place, and time. No cranial nerve deficit.  Skin: Skin is warm and dry.  Psychiatric: She has a normal mood and affect.  Nursing note and vitals reviewed.  Filed Vitals:   01/06/16 1052 01/06/16 1345  BP: 124/94 114/83  Pulse: 103 84  Temp: 97.6 F (36.4 C)   TempSrc: Oral   Resp: 28 17  SpO2: 100% 99%      ED Course  Procedures (including critical care time) Labs Review Labs Reviewed  BASIC  METABOLIC PANEL - Abnormal; Notable for the following:    CO2 18 (*)    Glucose, Bld 125 (*)    All other components within normal limits  HEPATIC FUNCTION PANEL - Abnormal; Notable for the following:    Total Protein 8.4 (*)    Indirect Bilirubin 0.1 (*)    All other components within normal limits  CBC WITH DIFFERENTIAL/PLATELET  LIPASE, BLOOD    Imaging Review No results found. I have personally reviewed and evaluated these images and lab results as part of my medical decision-making.   EKG Interpretation   Date/Time:  Sunday January 06 2016 11:33:49 EST Ventricular Rate:  92 PR Interval:  238 QRS Duration: 83 QT Interval:  382 QTC Calculation: 473 R Axis:   85 Text Interpretation:  Sinus rhythm Prolonged PR interval RAE, consider  biatrial enlargement Nonspecific T abnormalities, anterior leads Abnormal  ekg Confirmed by Bebe Shaggy  MD, Dorinda Hill ([id number]) on 01/06/2016 12:03:46 PM      MDM   Final diagnoses:  Generalized abdominal pain  Medication reaction, initial encounter    Suspect disulfram like reaction as pt taking Flagyl and drank a significant amount of EtOH last night. Her labs are unremarkable. Her pain has improved with pain meds. She is able to tolerate PO. HR initially tachycardic which I suspect secondary to hypovolemia, pain, and anxiety as her HR remains stable in 70s-80s after fluids and pain meds. Her chest wall pain is reproducible with palpation, and i suspect is msk secondary to multiple episodes of emesis. Her pain is resolved now. Do not suspect ACS. I discussed with pt to avoid EtOH and Flagyl together. Rx given for bentyl and zofran. Pt nontoxic and stable for discharge with outpatient f/u. ER return precautions given.     Carlene Coria, PA-C 01/07/16 1433  Zadie Rhine, MD 01/07/16 1540

## 2016-01-06 NOTE — ED Notes (Addendum)
Pt presents sitting in the bathroom floor repeating "I'm going to die, I'm going to die." States she was taking Metronidazole for BV, when she used cocaine and drank alcohol last night. Now she's vomiting, can't stop having diarrhea, and occasional chest pains and stabbing central abdominal pain.

## 2016-04-13 ENCOUNTER — Encounter (HOSPITAL_COMMUNITY): Payer: Self-pay | Admitting: Emergency Medicine

## 2016-04-13 ENCOUNTER — Emergency Department (HOSPITAL_COMMUNITY)
Admission: EM | Admit: 2016-04-13 | Discharge: 2016-04-13 | Disposition: A | Discharge status: Home / Self Care | Payer: Self-pay | Attending: Emergency Medicine | Admitting: Emergency Medicine

## 2016-04-13 DIAGNOSIS — Z79899 Other long term (current) drug therapy: Secondary | ICD-10-CM | POA: Insufficient documentation

## 2016-04-13 DIAGNOSIS — H65191 Other acute nonsuppurative otitis media, right ear: Secondary | ICD-10-CM | POA: Insufficient documentation

## 2016-04-13 DIAGNOSIS — F17210 Nicotine dependence, cigarettes, uncomplicated: Secondary | ICD-10-CM | POA: Insufficient documentation

## 2016-04-13 DIAGNOSIS — Z792 Long term (current) use of antibiotics: Secondary | ICD-10-CM | POA: Insufficient documentation

## 2016-04-13 MED ORDER — AMOXICILLIN 500 MG PO CAPS: 500.0000 mg | ORAL_CAPSULE | Freq: Three times a day (TID) | ORAL | Status: AC

## 2016-04-13 NOTE — ED Provider Notes (Signed)
CSN: [id number]     Arrival date & time 04/13/16  0654 History   First MD Initiated Contact with Patient 04/13/16 0719     Chief Complaint  Patient presents with  . URI     (Consider location/radiation/quality/duration/timing/severity/associated sxs/prior Treatment) Patient is a 20 y.o. female presenting with URI. The history is provided by the patient. No language interpreter was used.  URI Presenting symptoms: congestion, ear pain and sore throat   Severity:  Moderate Onset quality:  Gradual Duration:  1 day Timing:  Constant Progression:  Worsening Chronicity:  New Relieved by:  Nothing Worsened by:  Nothing tried Ineffective treatments:  OTC medications Associated symptoms: headaches   Risk factors: sick contacts   Risk factors: no recent illness     Past Medical History  Diagnosis Date  . Eczema   . Polysubstance abuse     cocaine marijuana  . Drug overdose, intentional (HCC)   . ODD (oppositional defiant disorder)    Past Surgical History  Procedure Laterality Date  . Wisdom tooth extraction     No family history on file. Social History  Substance Use Topics  . Smoking status: Current Every Day Smoker -- 0.33 packs/day for 3 years    Types: Cigarettes  . Smokeless tobacco: None  . Alcohol Use: Yes     Comment: 2 x /month   OB History    No data available     Review of Systems  HENT: Positive for congestion, ear pain and sore throat.   Neurological: Positive for headaches.  All other systems reviewed and are negative.     Allergies  Review of patient's allergies indicates no known allergies.  Home Medications   Prior to Admission medications   Medication Sig Start Date End Date Taking? Authorizing Provider  dicyclomine (BENTYL) 20 MG tablet Take 1 tablet (20 mg total) by mouth 2 (two) times daily. 01/06/16   Ace GinsSerena Y Sam, PA-C  ferrous sulfate 325 (65 FE) MG tablet Take 1 tablet (325 mg total) by mouth daily. Patient not taking: Reported on  01/06/2016 06/27/14   Emilia BeckKaitlyn Szekalski, PA-C  metroNIDAZOLE (FLAGYL) 500 MG tablet Take 500 mg by mouth 2 (two) times daily. For 7 days 01/04/16   Historical Provider, MD  Norgestimate-Ethinyl Estradiol Triphasic (TRINESSA, 28,) 0.18/0.215/0.25 MG-35 MCG tablet Take 1 tablet by mouth daily.    Historical Provider, MD  ondansetron (ZOFRAN ODT) 4 MG disintegrating tablet Take 1 tablet (4 mg total) by mouth every 8 (eight) hours as needed for nausea or vomiting. 01/06/16   Ace GinsSerena Y Sam, PA-C   BP 126/80 mmHg  Pulse 107  Temp(Src) 98 F (36.7 C) (Oral)  Resp 18  SpO2 100%  LMP 04/13/2016 Physical Exam  Constitutional: She is oriented to person, place, and time. She appears well-developed and well-nourished.  HENT:  Head: Normocephalic.  Right Ear: External ear normal.  Left Ear: External ear normal.  Nose: Nose normal.  Mouth/Throat: Oropharynx is clear and moist.  Fluid behind bilat tm's. Erythema right  Eyes: Conjunctivae and EOM are normal. Pupils are equal, round, and reactive to light.  Neck: Normal range of motion.  Cardiovascular: Normal rate.   Pulmonary/Chest: Effort normal.  Abdominal: Soft. She exhibits no distension.  Musculoskeletal: Normal range of motion.  Neurological: She is alert and oriented to person, place, and time.  Skin: Skin is warm.  Psychiatric: She has a normal mood and affect.  Nursing note and vitals reviewed.   ED Course  Procedures (including critical  care time) Labs Review Labs Reviewed - No data to display  Imaging Review No results found. I have personally reviewed and evaluated these images and lab results as part of my medical decision-making.   EKG Interpretation None      MDM   Final diagnoses:  Acute nonsuppurative otitis media of right ear    Meds ordered this encounter  Medications  . amoxicillin (AMOXIL) 500 MG capsule    Sig: Take 1 capsule (500 mg total) by mouth 3 (three) times daily.    Dispense:  30 capsule    Refill:   0    Order Specific Question:  Supervising Provider    Answer:  Eber Hong [[id number]]      Lonia Skinner [place], PA-C 04/13/16 1610  Azalia Bilis, MD 04/13/16 [phone]

## 2016-04-13 NOTE — ED Notes (Signed)
Per pt, states cold symptoms, ear ache, headache, sore throat since yesterday-no relief with OTC meds

## 2016-04-13 NOTE — Discharge Instructions (Signed)

## 2016-04-13 NOTE — ED Notes (Signed)
Discharge instructions, follow up care, and rx x1 reviewed with patient. Patient verbalized understanding. 

## 2020-07-17 ENCOUNTER — Other Ambulatory Visit: Payer: Self-pay

## 2020-08-10 ENCOUNTER — Emergency Department (HOSPITAL_COMMUNITY)
Admission: EM | Admit: 2020-08-10 | Discharge: 2020-08-11 | Disposition: A | Discharge status: Home / Self Care | Payer: Self-pay | Attending: Emergency Medicine | Admitting: Emergency Medicine

## 2020-08-10 ENCOUNTER — Other Ambulatory Visit: Payer: Self-pay

## 2020-08-10 ENCOUNTER — Encounter (HOSPITAL_COMMUNITY): Payer: Self-pay | Admitting: Emergency Medicine

## 2020-08-10 DIAGNOSIS — T407X1A Poisoning by cannabis (derivatives), accidental (unintentional), initial encounter: Secondary | ICD-10-CM | POA: Insufficient documentation

## 2020-08-10 DIAGNOSIS — T50901A Poisoning by unspecified drugs, medicaments and biological substances, accidental (unintentional), initial encounter: Secondary | ICD-10-CM

## 2020-08-10 DIAGNOSIS — F17210 Nicotine dependence, cigarettes, uncomplicated: Secondary | ICD-10-CM | POA: Insufficient documentation

## 2020-08-10 LAB — CBC WITH DIFFERENTIAL/PLATELET
Abs Immature Granulocytes: 0.03 10*3/uL (ref 0.00–0.07)
Basophils Absolute: 0 10*3/uL (ref 0.0–0.1)
Basophils Relative: 0 %
Eosinophils Absolute: 0 10*3/uL (ref 0.0–0.5)
Eosinophils Relative: 0 %
HCT: 36 % (ref 36.0–46.0)
Hemoglobin: 12.3 g/dL (ref 12.0–15.0)
Immature Granulocytes: 0 %
Lymphocytes Relative: 12 %
Lymphs Abs: 1.2 10*3/uL (ref 0.7–4.0)
MCH: 31.9 pg (ref 26.0–34.0)
MCHC: 34.2 g/dL (ref 30.0–36.0)
MCV: 93.5 fL (ref 80.0–100.0)
Monocytes Absolute: 0.6 10*3/uL (ref 0.1–1.0)
Monocytes Relative: 6 %
Neutro Abs: 8 10*3/uL — ABNORMAL HIGH (ref 1.7–7.7)
Neutrophils Relative %: 82 %
Platelets: 269 10*3/uL (ref 150–400)
RBC: 3.85 MIL/uL — ABNORMAL LOW (ref 3.87–5.11)
RDW: 11.9 % (ref 11.5–15.5)
WBC: 9.9 10*3/uL (ref 4.0–10.5)
nRBC: 0 % (ref 0.0–0.2)

## 2020-08-10 LAB — COMPREHENSIVE METABOLIC PANEL
ALT: 10 U/L (ref 0–44)
AST: 20 U/L (ref 15–41)
Albumin: 3.5 g/dL (ref 3.5–5.0)
Alkaline Phosphatase: 40 U/L (ref 38–126)
Anion gap: 10 (ref 5–15)
BUN: 5 mg/dL — ABNORMAL LOW (ref 6–20)
CO2: 19 mmol/L — ABNORMAL LOW (ref 22–32)
Calcium: 8.2 mg/dL — ABNORMAL LOW (ref 8.9–10.3)
Chloride: 103 mmol/L (ref 98–111)
Creatinine, Ser: 0.49 mg/dL (ref 0.44–1.00)
GFR calc Af Amer: >60 mL/min (ref >60)
GFR calc non Af Amer: >60 mL/min (ref >60)
Glucose, Bld: 99 mg/dL (ref 70–99)
Potassium: 3.4 mmol/L — ABNORMAL LOW (ref 3.5–5.1)
Sodium: 132 mmol/L — ABNORMAL LOW (ref 135–145)
Total Bilirubin: 0.6 mg/dL (ref 0.3–1.2)
Total Protein: 6.3 g/dL — ABNORMAL LOW (ref 6.5–8.1)

## 2020-08-10 LAB — URINALYSIS, ROUTINE W REFLEX MICROSCOPIC
Bilirubin Urine: NEGATIVE
Glucose, UA: NEGATIVE mg/dL
Ketones, ur: NEGATIVE mg/dL
Leukocytes,Ua: NEGATIVE
Nitrite: NEGATIVE
Protein, ur: NEGATIVE mg/dL
Specific Gravity, Urine: 1.004 — ABNORMAL LOW (ref 1.005–1.030)
pH: 5 (ref 5.0–8.0)

## 2020-08-10 LAB — I-STAT BETA HCG BLOOD, ED (MC, WL, AP ONLY): I-stat hCG, quantitative: <5 m[IU]/mL (ref <5)

## 2020-08-10 LAB — RAPID URINE DRUG SCREEN, HOSP PERFORMED
Amphetamines: NOT DETECTED
Barbiturates: NOT DETECTED
Benzodiazepines: NOT DETECTED
Cocaine: NOT DETECTED
Opiates: NOT DETECTED
Tetrahydrocannabinol: POSITIVE — AB

## 2020-08-10 LAB — ETHANOL: Alcohol, Ethyl (B): <10 mg/dL (ref <10)

## 2020-08-10 MED ORDER — ONDANSETRON HCL 4 MG/2ML IJ SOLN
4.0000 mg | Freq: Once | INTRAMUSCULAR | Status: AC
  Administered 2020-08-10: 4 mg via INTRAVENOUS
  Filled 2020-08-10 (×2): qty 2

## 2020-08-10 MED ORDER — LACTATED RINGERS IV BOLUS
1000.0000 mL | Freq: Once | INTRAVENOUS | Status: AC
  Administered 2020-08-10: 1000 mL via INTRAVENOUS

## 2020-08-10 MED ORDER — LORAZEPAM 2 MG/ML IJ SOLN
1.0000 mg | Freq: Once | INTRAMUSCULAR | Status: AC
  Administered 2020-08-10: 1 mg via INTRAVENOUS
  Filled 2020-08-10 (×2): qty 1

## 2020-08-10 NOTE — Discharge Instructions (Addendum)
Recommend you do not use CBD oils or foods.   Follow up with your primary care doctor to further discuss your anxiety.  Return for new or worsening symptoms

## 2020-08-10 NOTE — ED Triage Notes (Signed)
Emergency Medicine Provider Triage Evaluation Note  [name] , a 24 y.o. female  was evaluated in triage.  Pt complains of anxiousness and palpitations following ingestion of CBD infused chocolate and gummies.  Ingestion occurred around 3:30 PM today.  Review of Systems  Positive: Nervousness and palpitations.  Nausea. Negative: No chest pain, shortness of breath, cough, syncope, abdominal pain, vomiting, diarrhea.  Physical Exam  BP (!) 142/93 (BP Location: Right Arm)   Pulse (!) 126   Temp 98.6 F (37 C) (Oral)   Resp 16   SpO2 100%  Gen:   Awake, anxious HEENT:  Atraumatic Resp:  Normal effort  Cardiac:  Tachycardic, regular rhythm Abd:   Nondistended, nontender  MSK:   Moves extremities without difficulty  Neuro:  Speech clear   Medical Decision Making  Medically screening exam initiated at 6:55 PM.  Appropriate orders placed.  Amanda Khan was informed that the remainder of the evaluation will be completed by another provider, this initial triage assessment does not replace that evaluation, and the importance of remaining in the ED until their evaluation is complete.  Clinical Impression    Patient with palpitations, nausea, anxiousness after ingestion of CBD.  Discussed patient with Dr. Myrtis Ser. Agreed on initial therapy choices.   Vitals:   08/10/20 1658 08/10/20 1804  BP: (!) 149/97 (!) 142/93  Pulse: (!) 143 (!) 126  Resp: 16 16  Temp: (!) 97.5 F (36.4 C) 98.6 F (37 C)  TempSrc: Oral Oral  SpO2: 100% 100%      Anselm Pancoast, PA-C 08/10/20 2154

## 2020-08-10 NOTE — ED Provider Notes (Signed)
[facility] EMERGENCY DEPARTMENT Provider Note   CSN: [id number] Arrival date & time: 08/10/20  1657     History Chief Complaint  Patient presents with  . Drug Overdose    Amanda Khan is a 24 y.o. female with past medical history significant for intentional drug overdose, eczema, oppositional defiant disorder, polysubstance abuse.  HPI Patient presents to emergency department today via EMS with chief complaint of drug overdose. Patient states she went to the CBD shop and bought delta 8. She was feeling anxious and wanted to calm down so she ate 9 pieces of chocolate with delta 8. She states after eating it she felt like her heart was going to beat out of her chest. She also felt a vibrating sensation in her chest. She denies being in any pain. She has used CBD oil in the past however did not feel like it helped her anxiety which prompted her to take increased dose today. She denies any suicidal or homicidal ideations. She admits to drinking alcohol twice per week, not having today. Denies any other drug use today.    Past Medical History:  Diagnosis Date  . Drug overdose, intentional (HCC)   . Eczema   . ODD (oppositional defiant disorder)   . Polysubstance abuse (HCC)    cocaine marijuana    Patient Active Problem List   Diagnosis Date Noted  . VIRAL GASTROENTERITIS 12/21/2007    Past Surgical History:  Procedure Laterality Date  . WISDOM TOOTH EXTRACTION       OB History   No obstetric history on file.     No family history on file.  Social History   Tobacco Use  . Smoking status: Current Every Day Smoker    Packs/day: 0.33    Years: 3.00    Pack years: 0.99    Types: Cigarettes  Substance Use Topics  . Alcohol use: Yes    Comment: 2 x /month  . Drug use: No    Types: Cocaine, Marijuana    Comment: none since 11/28/2013    Home Medications Prior to Admission medications   Medication Sig Start Date End Date Taking? Authorizing  Provider  CANNABIDIOL PO Take by mouth. 5 gummies   Yes [provider]  amoxicillin (AMOXIL) 500 MG capsule Take 1 capsule (500 mg total) by mouth 3 (three) times daily. Patient not taking: Reported on 08/10/2020 04/13/16   Elson Areas, PA-C  dicyclomine (BENTYL) 20 MG tablet Take 1 tablet (20 mg total) by mouth 2 (two) times daily. Patient not taking: Reported on 08/10/2020 01/06/16   Carlene Coria, PA-C  ferrous sulfate 325 (65 FE) MG tablet Take 1 tablet (325 mg total) by mouth daily. Patient not taking: Reported on 01/06/2016 06/27/14   Emilia Beck, PA-C  ondansetron (ZOFRAN ODT) 4 MG disintegrating tablet Take 1 tablet (4 mg total) by mouth every 8 (eight) hours as needed for nausea or vomiting. Patient not taking: Reported on 08/10/2020 01/06/16   Carlene Coria, PA-C    Allergies    Patient has no known allergies.  Review of Systems   Review of Systems  All other systems are reviewed and are negative for acute change except as noted in the HPI.   Physical Exam Updated Vital Signs BP 122/84   Pulse (!) 105   Temp 98.6 F (37 C) (Oral)   Resp 19   SpO2 96%   Physical Exam Vitals and nursing note reviewed.  Constitutional:  General: She is not in acute distress.    Appearance: She is not ill-appearing.  HENT:     Head: Normocephalic and atraumatic.     Right Ear: Tympanic membrane and external ear normal.     Left Ear: Tympanic membrane and external ear normal.     Nose: Nose normal.     Mouth/Throat:     Mouth: Mucous membranes are moist.     Pharynx: Oropharynx is clear.  Eyes:     General: No scleral icterus.       Right eye: No discharge.        Left eye: No discharge.     Extraocular Movements: Extraocular movements intact.     Conjunctiva/sclera: Conjunctivae normal.     Pupils: Pupils are equal, round, and reactive to light.  Neck:     Vascular: No JVD.  Cardiovascular:     Rate and Rhythm: Normal rate and regular rhythm.     Pulses:  Normal pulses.          Radial pulses are 2+ on the right side and 2+ on the left side.     Heart sounds: Normal heart sounds.  Pulmonary:     Comments: Lungs clear to auscultation in all fields. Symmetric chest rise. No wheezing, rales, or rhonchi. Abdominal:     Comments: Abdomen is soft, non-distended, and non-tender in all quadrants. No rigidity, no guarding. No peritoneal signs.  Musculoskeletal:        General: Normal range of motion.     Cervical back: Normal range of motion.  Skin:    General: Skin is warm and dry.     Capillary Refill: Capillary refill takes less than 2 seconds.  Neurological:     Mental Status: She is oriented to person, place, and time.     GCS: GCS eye subscore is 4. GCS verbal subscore is 5. GCS motor subscore is 6.     Comments: Fluent speech, no facial droop.  Psychiatric:        Mood and Affect: Mood is anxious. Affect is flat.        Behavior: Behavior normal.        Thought Content: Thought content does not include homicidal or suicidal ideation. Thought content does not include homicidal or suicidal plan.     ED Results / Procedures / Treatments   Labs (all labs ordered are listed, but only abnormal results are displayed) Labs Reviewed  COMPREHENSIVE METABOLIC PANEL - Abnormal; Notable for the following components:      Result Value   Sodium 132 (*)    Potassium 3.4 (*)    CO2 19 (*)    BUN 5 (*)    Calcium 8.2 (*)    Total Protein 6.3 (*)    All other components within normal limits  CBC WITH DIFFERENTIAL/PLATELET - Abnormal; Notable for the following components:   RBC 3.85 (*)    Neutro Abs 8.0 (*)    All other components within normal limits  RAPID URINE DRUG SCREEN, HOSP PERFORMED - Abnormal; Notable for the following components:   Tetrahydrocannabinol POSITIVE (*)    All other components within normal limits  URINALYSIS, ROUTINE W REFLEX MICROSCOPIC - Abnormal; Notable for the following components:   APPearance HAZY (*)     Specific Gravity, Urine 1.004 (*)    Hgb urine dipstick LARGE (*)    Bacteria, UA MANY (*)    All other components within normal limits  ETHANOL  I-STAT BETA HCG  BLOOD, ED ([facility], [facility], [facility] ONLY)    EKG EKG Interpretation  Date/Time:  Friday August 10 2020 17:09:08 EDT Ventricular Rate:  135 PR Interval:    QRS Duration: 94 QT Interval:  382 QTC Calculation: 573 R Axis:   89 Text Interpretation: No STEMI, prolonged QTC Confirmed by Alvester Chou [phone]) on 08/10/2020 9:12:54 PM   EKG Interpretation  Date/Time:  Friday August 10 2020 23:33:05 EDT Ventricular Rate:  110 PR Interval:    QRS Duration: 81 QT Interval:  322 QTC Calculation: 436 R Axis:   89 Text Interpretation: Sinus tachycardia Borderline T abnormalities, anterior leads Confirmed by Gilda Crease [phone]) on 08/10/2020 11:46:03 PM        Radiology No results found.  Procedures Procedures (including critical care time)  Medications Ordered in ED Medications  LORazepam (ATIVAN) injection 1 mg (1 mg Intravenous Given 08/10/20 2036)  ondansetron (ZOFRAN) injection 4 mg (4 mg Intravenous Given 08/10/20 2036)  lactated ringers bolus 1,000 mL (0 mLs Intravenous Stopped 08/10/20 2240)  lactated ringers bolus 1,000 mL (1,000 mLs Intravenous New Bag/Given 08/10/20 2259)    ED Course  I have reviewed the triage vital signs and the nursing notes.  Pertinent labs & imaging results that were available during my care of the patient were reviewed by me and considered in my medical decision making (see chart for details).  Vitals:   08/10/20 2200 08/10/20 2230 08/10/20 2300 08/10/20 2330  BP: 114/71 112/71 108/67 110/66  Pulse: (!) 103 (!) 109 (!) 103 99  Resp: 15 17 16  (!) 26  Temp:      TempSrc:      SpO2: 97% 99% 97% 97%    Clinical Course as of Aug 10 2349  Fri Aug 10, 2020  2231 Patient reassessed. Will wake to verbal stimuli then goes to sleep. HR in the 80s while sleeping, 115 while awake.  Mother at bedside. Will give second liter of fluids   [KA]  3638 24 year old female presenting to ED after injection of large amounts of THC-containing edibles.  Patient was quite agitated on arrival, endorsing paranoia and screaming "Am I going to die in the waiting room?"  She was given IV zofran, ativan, and LR bolus    [MT]    Clinical Course User Index [KA] Jaquetta Currier, 28, PA-C [MT] Caroleen Hamman, Renaye Rakers, MD   MDM Rules/Calculators/A&P                          History provided by patient with additional history obtained from chart review.     Patient had wait time of approximately 3 hours prior to my exam 2/2 high volume. On my initial exam she is tachycardic to the 120s, falling asleep while talking. No chest pain at this time. EKG from triage shows prolonged QTc 573. Patient was also given zofran in triage. Will hold off on any further medicines to prolong QTc.  Basic labs checked. No leukocytosis, no anemia. CMP shows potassium of 3.4, mild hypocalcemia 8.2, bicarb slightly low 19, normal anion gap.  Pregnancy test is negative. Ethanol negative. UDS is positive for tetrahydrocannabinol only.  Patient given IVF, ativan. On reassessment tachycardia has improved, HR in the 90s. Repeat EKG was Khan and shows normal QTc. Patient calm with normal mood. Patient denies any attempt for self harm when asked multiple times, feels safe going home. She only ate chocolate today because she was anxious. Will discharge home with  her mother.  The patient appears reasonably screened and/or stabilized for discharge and I doubt any other medical condition or other [facility] requiring further screening, evaluation, or treatment in the ED at this time prior to discharge. The patient is safe for discharge with strict return precautions discussed. Recommend pcp follow up. Findings and plan of care discussed with supervising physician Dr. Renaye Rakers.   Portions of this note were generated with [employer]. Dictation errors may occur despite best attempts at proofreading.   Final Clinical Impression(s) / ED Diagnoses Final diagnoses:  Accidental drug overdose, initial encounter    Rx / DC Orders ED Discharge Orders    None       Kathyrn Lass 08/10/20 2351    Terald Sleeper, MD 08/11/20 0025

## 2020-08-10 NOTE — ED Triage Notes (Signed)
Pt brought to ED by EMS from home after having an OD on delta 8 pt took 9 pieces of chocolate with delta 8. On EMS arrival pt very anxious, HR 160-180, BP 124/73 CBG 102, SPO2 98% RA.

## 2021-04-10 ENCOUNTER — Encounter (HOSPITAL_COMMUNITY): Payer: Self-pay | Admitting: Emergency Medicine
